# Patient Record
Sex: Female | Born: 1945 | Race: White | Hispanic: No | Smoking: Never smoker
Health system: Southern US, Community
[De-identification: ages and names within clinical notes are randomized; demographics above are authoritative.]

## PROBLEM LIST (undated history)

## (undated) DIAGNOSIS — M810 Age-related osteoporosis without current pathological fracture: Secondary | ICD-10-CM

## (undated) DIAGNOSIS — M1990 Unspecified osteoarthritis, unspecified site: Secondary | ICD-10-CM

## (undated) DIAGNOSIS — G479 Sleep disorder, unspecified: Secondary | ICD-10-CM

## (undated) DIAGNOSIS — R351 Nocturia: Secondary | ICD-10-CM

## (undated) DIAGNOSIS — R3915 Urgency of urination: Secondary | ICD-10-CM

## (undated) DIAGNOSIS — K219 Gastro-esophageal reflux disease without esophagitis: Secondary | ICD-10-CM

## (undated) DIAGNOSIS — Z8489 Family history of other specified conditions: Secondary | ICD-10-CM

## (undated) DIAGNOSIS — K5900 Constipation, unspecified: Secondary | ICD-10-CM

## (undated) HISTORY — PX: WISDOM TOOTH EXTRACTION: SHX21

---

## 2012-02-03 DIAGNOSIS — J301 Allergic rhinitis due to pollen: Secondary | ICD-10-CM | POA: Insufficient documentation

## 2014-03-30 DIAGNOSIS — M7581 Other shoulder lesions, right shoulder: Secondary | ICD-10-CM | POA: Insufficient documentation

## 2014-03-30 DIAGNOSIS — M5416 Radiculopathy, lumbar region: Secondary | ICD-10-CM | POA: Insufficient documentation

## 2014-03-30 DIAGNOSIS — M8700 Idiopathic aseptic necrosis of unspecified bone: Secondary | ICD-10-CM | POA: Insufficient documentation

## 2014-05-19 DIAGNOSIS — G8929 Other chronic pain: Secondary | ICD-10-CM | POA: Insufficient documentation

## 2014-09-21 NOTE — Progress Notes (Signed)
Please put orders in Epic surgery 10-13-14 pre op 10-05-13 Thanks

## 2014-09-22 ENCOUNTER — Other Ambulatory Visit (HOSPITAL_COMMUNITY): Payer: Self-pay | Admitting: Orthopaedic Surgery

## 2014-10-04 ENCOUNTER — Other Ambulatory Visit (HOSPITAL_COMMUNITY): Payer: Self-pay | Admitting: *Deleted

## 2014-10-04 NOTE — Patient Instructions (Addendum)
Karen BeltonDianne Hernandez  10/04/2014   Your procedure is scheduled on: 10/13/14   [facility] and follow signs to               Short Stay Center at 8:30 AM.   Call this number if you have problems the morning of surgery [phone]   Remember:  Do not eat food or drink liquids :After Midnight.     Take these medicines the morning of surgery with A SIP OF WATER: MAY TAKE HYDROCODONE IF NEEDED                               You may not have any metal on your body including hair pins and              piercings  Do not wear jewelry, make-up, lotions, powders or perfumes.             Do not wear nail polish.  Do not shave  48 hours prior to surgery.              Men may shave face and neck.   Do not bring valuables to the hospital. [place] IS NOT             RESPONSIBLE   FOR VALUABLES.  Contacts, dentures or bridgework may not be worn into surgery.  Leave suitcase in the car. After surgery it may be brought to your room.     Patients discharged the day of surgery will not be allowed to drive home.  Name and phone number of your driver:  Special Instructions: N/A              Please read over the following fact sheets you were given: _____________________________________________________________________                                                     [place] - PREPARING FOR SURGERY  Before surgery, you can play an important role.  Because skin is not sterile, your skin needs to be as free of germs as possible.  You can reduce the number of germs on your skin by washing with CHG (chlorahexidine gluconate) soap before surgery.  CHG is an antiseptic cleaner which kills germs and bonds with the skin to continue killing germs even after washing. Please DO NOT use if you have an allergy to CHG or antibacterial soaps.  If your skin becomes reddened/irritated stop using the CHG and inform your nurse when you arrive at Short Stay. Do not shave  (including legs and underarms) for at least 48 hours prior to the first CHG shower.  You may shave your face. Please follow these instructions carefully:   1.  Shower with CHG Soap the night before surgery and the  morning of Surgery.   2.  If you choose to wash your hair, wash your hair first as usual with your  normal  Shampoo.   3.  After you shampoo, rinse your hair and body thoroughly to remove the  shampoo.  4.  Use CHG as you would any other liquid soap.  You can apply chg directly  to the skin and wash . Gently wash with scrungie or clean wascloth    5.  Apply the CHG Soap to your body ONLY FROM THE NECK DOWN.   Do not use on open                           Wound or open sores. Avoid contact with eyes, ears mouth and genitals (private parts).                        Genitals (private parts) with your normal soap.              6.  Wash thoroughly, paying special attention to the area where your surgery  will be performed.   7.  Thoroughly rinse your body with warm water from the neck down.   8.  DO NOT shower/wash with your normal soap after using and rinsing off  the CHG Soap .                9.  Pat yourself dry with a clean towel.             10.  Wear clean pajamas.             11.  Place clean sheets on your bed the night of your first shower and do not  sleep with pets.  Day of Surgery : Do not apply any lotions/deodorants the morning of surgery.  Please wear clean clothes to the hospital/surgery center.  FAILURE TO FOLLOW THESE INSTRUCTIONS MAY RESULT IN THE CANCELLATION OF YOUR SURGERY    PATIENT SIGNATURE_________________________________  ______________________________________________________________________

## 2014-10-05 ENCOUNTER — Encounter (HOSPITAL_COMMUNITY): Payer: Self-pay

## 2014-10-05 ENCOUNTER — Encounter (HOSPITAL_COMMUNITY)
Admission: RE | Admit: 2014-10-05 | Discharge: 2014-10-05 | Disposition: A | Discharge status: Home / Self Care | Payer: Medicare HMO | Source: Ambulatory Visit | Attending: Orthopaedic Surgery | Admitting: Orthopaedic Surgery

## 2014-10-05 DIAGNOSIS — Z01812 Encounter for preprocedural laboratory examination: Secondary | ICD-10-CM | POA: Diagnosis present

## 2014-10-05 HISTORY — DX: Nocturia: R35.1

## 2014-10-05 HISTORY — DX: Constipation, unspecified: K59.00

## 2014-10-05 HISTORY — DX: Unspecified osteoarthritis, unspecified site: M19.90

## 2014-10-05 HISTORY — DX: Urgency of urination: R39.15

## 2014-10-05 HISTORY — DX: Sleep disorder, unspecified: G47.9

## 2014-10-05 HISTORY — DX: Family history of other specified conditions: Z84.89

## 2014-10-05 HISTORY — DX: Age-related osteoporosis without current pathological fracture: M81.0

## 2014-10-05 LAB — URINALYSIS, ROUTINE W REFLEX MICROSCOPIC
BILIRUBIN URINE: NEGATIVE
Glucose, UA: NEGATIVE mg/dL
Hgb urine dipstick: NEGATIVE
Ketones, ur: NEGATIVE mg/dL
Leukocytes, UA: NEGATIVE
NITRITE: NEGATIVE
PH: 7.5 (ref 5.0–8.0)
PROTEIN: NEGATIVE mg/dL
Specific Gravity, Urine: 1.01 (ref 1.005–1.030)
Urobilinogen, UA: 0.2 mg/dL (ref 0.0–1.0)

## 2014-10-05 LAB — BASIC METABOLIC PANEL
Anion gap: 7 (ref 5–15)
BUN: 22 mg/dL (ref 6–23)
CALCIUM: 9.2 mg/dL (ref 8.4–10.5)
CO2: 31 mmol/L (ref 19–32)
Chloride: 100 mEq/L (ref 96–112)
Creatinine, Ser: 0.64 mg/dL (ref 0.50–1.10)
GFR calc Af Amer: >90 mL/min (ref >90)
Glucose, Bld: 103 mg/dL — ABNORMAL HIGH (ref 70–99)
Potassium: 4 mmol/L (ref 3.5–5.1)
Sodium: 138 mmol/L (ref 135–145)

## 2014-10-05 LAB — CBC
HCT: 36.7 % (ref 36.0–46.0)
Hemoglobin: 11.6 g/dL — ABNORMAL LOW (ref 12.0–15.0)
MCH: 27.8 pg (ref 26.0–34.0)
MCHC: 31.6 g/dL (ref 30.0–36.0)
MCV: 88 fL (ref 78.0–100.0)
PLATELETS: 308 10*3/uL (ref 150–400)
RBC: 4.17 MIL/uL (ref 3.87–5.11)
RDW: 13 % (ref 11.5–15.5)
WBC: 6.5 10*3/uL (ref 4.0–10.5)

## 2014-10-05 LAB — SURGICAL PCR SCREEN
MRSA, PCR: NEGATIVE
STAPHYLOCOCCUS AUREUS: NEGATIVE

## 2014-10-05 LAB — PROTIME-INR
INR: 0.98 (ref 0.00–1.49)
PROTHROMBIN TIME: 13.1 s (ref 11.6–15.2)

## 2014-10-05 LAB — APTT: APTT: 27 s (ref 24–37)

## 2014-10-05 LAB — ABO/RH: ABO/RH(D): A POS

## 2014-10-13 ENCOUNTER — Encounter (HOSPITAL_COMMUNITY): Payer: Self-pay | Admitting: Certified Registered"

## 2014-10-13 ENCOUNTER — Inpatient Hospital Stay (HOSPITAL_COMMUNITY): Payer: Medicare HMO

## 2014-10-13 ENCOUNTER — Inpatient Hospital Stay (HOSPITAL_COMMUNITY): Payer: Medicare HMO | Admitting: Certified Registered"

## 2014-10-13 ENCOUNTER — Encounter (HOSPITAL_COMMUNITY)
Admission: RE | Disposition: A | Discharge status: Home / Self Care | Payer: Self-pay | Source: Ambulatory Visit | Attending: Orthopaedic Surgery

## 2014-10-13 ENCOUNTER — Inpatient Hospital Stay (HOSPITAL_COMMUNITY)
Admission: RE | Admit: 2014-10-13 | Discharge: 2014-10-15 | DRG: 470 | Disposition: A | Discharge status: Home / Self Care | Payer: Medicare HMO | Source: Ambulatory Visit | Attending: Orthopaedic Surgery | Admitting: Orthopaedic Surgery

## 2014-10-13 DIAGNOSIS — Z96641 Presence of right artificial hip joint: Secondary | ICD-10-CM

## 2014-10-13 DIAGNOSIS — D62 Acute posthemorrhagic anemia: Secondary | ICD-10-CM | POA: Diagnosis not present

## 2014-10-13 DIAGNOSIS — R351 Nocturia: Secondary | ICD-10-CM | POA: Diagnosis present

## 2014-10-13 DIAGNOSIS — M1611 Unilateral primary osteoarthritis, right hip: Principal | ICD-10-CM

## 2014-10-13 DIAGNOSIS — Z419 Encounter for procedure for purposes other than remedying health state, unspecified: Secondary | ICD-10-CM

## 2014-10-13 DIAGNOSIS — R3915 Urgency of urination: Secondary | ICD-10-CM | POA: Diagnosis present

## 2014-10-13 DIAGNOSIS — K5900 Constipation, unspecified: Secondary | ICD-10-CM | POA: Diagnosis present

## 2014-10-13 HISTORY — PX: TOTAL HIP ARTHROPLASTY: SHX124

## 2014-10-13 LAB — TYPE AND SCREEN
ABO/RH(D): A POS
ANTIBODY SCREEN: NEGATIVE

## 2014-10-13 SURGERY — ARTHROPLASTY, HIP, TOTAL, ANTERIOR APPROACH
Anesthesia: Spinal | Site: Hip | Laterality: Right

## 2014-10-13 MED ORDER — LACTATED RINGERS IV SOLN
INTRAVENOUS | Status: DC
Start: 2014-10-13 — End: 2014-10-13
  Administered 2014-10-13: 12:00:00 via INTRAVENOUS
  Administered 2014-10-13: 1000 mL via INTRAVENOUS

## 2014-10-13 MED ORDER — HYDROMORPHONE HCL 1 MG/ML IJ SOLN
1.0000 mg | INTRAMUSCULAR | Status: DC | PRN
  Administered 2014-10-13 – 2014-10-14 (×2): 1 mg via INTRAVENOUS
  Filled 2014-10-13 (×2): qty 1

## 2014-10-13 MED ORDER — PROPOFOL INFUSION 10 MG/ML OPTIME
INTRAVENOUS | Status: DC | PRN
  Administered 2014-10-13: 100 ug/kg/min via INTRAVENOUS

## 2014-10-13 MED ORDER — SODIUM CHLORIDE 0.9 % IR SOLN
Status: DC | PRN
  Administered 2014-10-13: 1000 mL

## 2014-10-13 MED ORDER — DIPHENHYDRAMINE HCL 12.5 MG/5ML PO ELIX: 12.5000 mg | ORAL_SOLUTION | ORAL | Status: DC | PRN

## 2014-10-13 MED ORDER — EPHEDRINE SULFATE 50 MG/ML IJ SOLN
INTRAMUSCULAR | Status: AC
  Filled 2014-10-13: qty 1

## 2014-10-13 MED ORDER — METOCLOPRAMIDE HCL 5 MG/ML IJ SOLN: 5.0000 mg | Freq: Three times a day (TID) | INTRAMUSCULAR | Status: DC | PRN

## 2014-10-13 MED ORDER — ACETAMINOPHEN 650 MG RE SUPP: 650.0000 mg | Freq: Four times a day (QID) | RECTAL | Status: DC | PRN

## 2014-10-13 MED ORDER — FENTANYL CITRATE 0.05 MG/ML IJ SOLN: 25.0000 ug | INTRAMUSCULAR | Status: DC | PRN

## 2014-10-13 MED ORDER — METHOCARBAMOL 500 MG PO TABS
500.0000 mg | ORAL_TABLET | Freq: Four times a day (QID) | ORAL | Status: DC | PRN
  Administered 2014-10-13 – 2014-10-14 (×2): 500 mg via ORAL
  Filled 2014-10-13 (×2): qty 1

## 2014-10-13 MED ORDER — BUPIVACAINE HCL (PF) 0.5 % IJ SOLN
INTRAMUSCULAR | Status: AC
  Filled 2014-10-13: qty 30

## 2014-10-13 MED ORDER — DOCUSATE SODIUM 100 MG PO CAPS
100.0000 mg | ORAL_CAPSULE | Freq: Two times a day (BID) | ORAL | Status: DC
  Administered 2014-10-13 – 2014-10-15 (×4): 100 mg via ORAL
  Filled 2014-10-13 (×3): qty 1

## 2014-10-13 MED ORDER — CEFAZOLIN SODIUM-DEXTROSE 2-3 GM-% IV SOLR
INTRAVENOUS | Status: AC
  Filled 2014-10-13: qty 50

## 2014-10-13 MED ORDER — 0.9 % SODIUM CHLORIDE (POUR BTL) OPTIME
TOPICAL | Status: DC | PRN
  Administered 2014-10-13: 1000 mL

## 2014-10-13 MED ORDER — ALUM & MAG HYDROXIDE-SIMETH 200-200-20 MG/5ML PO SUSP: 30.0000 mL | ORAL | Status: DC | PRN

## 2014-10-13 MED ORDER — SODIUM CHLORIDE 0.9 % IJ SOLN
INTRAMUSCULAR | Status: AC
  Filled 2014-10-13: qty 10

## 2014-10-13 MED ORDER — ACETAMINOPHEN 10 MG/ML IV SOLN
1000.0000 mg | Freq: Once | INTRAVENOUS | Status: AC
  Administered 2014-10-13: 1000 mg via INTRAVENOUS
  Filled 2014-10-13: qty 100

## 2014-10-13 MED ORDER — OXYCODONE HCL 5 MG PO TABS
5.0000 mg | ORAL_TABLET | ORAL | Status: DC | PRN
  Administered 2014-10-13: 10 mg via ORAL
  Administered 2014-10-13: 5 mg via ORAL
  Administered 2014-10-13 – 2014-10-14 (×2): 10 mg via ORAL
  Administered 2014-10-14: 5 mg via ORAL
  Administered 2014-10-14: 10 mg via ORAL
  Administered 2014-10-14: 5 mg via ORAL
  Administered 2014-10-14 – 2014-10-15 (×2): 10 mg via ORAL
  Administered 2014-10-15: 5 mg via ORAL
  Administered 2014-10-15 (×2): 10 mg via ORAL
  Filled 2014-10-13: qty 1
  Filled 2014-10-13 (×4): qty 2
  Filled 2014-10-13 (×2): qty 1
  Filled 2014-10-13 (×3): qty 2
  Filled 2014-10-13: qty 1
  Filled 2014-10-13: qty 2

## 2014-10-13 MED ORDER — PROMETHAZINE HCL 25 MG/ML IJ SOLN: 6.2500 mg | INTRAMUSCULAR | Status: DC | PRN

## 2014-10-13 MED ORDER — ASPIRIN EC 325 MG PO TBEC
325.0000 mg | DELAYED_RELEASE_TABLET | Freq: Two times a day (BID) | ORAL | Status: DC
  Administered 2014-10-13 – 2014-10-15 (×4): 325 mg via ORAL
  Filled 2014-10-13 (×7): qty 1

## 2014-10-13 MED ORDER — ACETAMINOPHEN 325 MG PO TABS
650.0000 mg | ORAL_TABLET | Freq: Four times a day (QID) | ORAL | Status: DC | PRN
  Administered 2014-10-14: 650 mg via ORAL
  Filled 2014-10-13: qty 2

## 2014-10-13 MED ORDER — MENTHOL 3 MG MT LOZG
1.0000 | LOZENGE | OROMUCOSAL | Status: DC | PRN
  Filled 2014-10-13: qty 9

## 2014-10-13 MED ORDER — ZOLPIDEM TARTRATE 5 MG PO TABS: 5.0000 mg | ORAL_TABLET | Freq: Every evening | ORAL | Status: DC | PRN

## 2014-10-13 MED ORDER — PHENOL 1.4 % MT LIQD
1.0000 | OROMUCOSAL | Status: DC | PRN
  Filled 2014-10-13: qty 177

## 2014-10-13 MED ORDER — MIDAZOLAM HCL 2 MG/2ML IJ SOLN
INTRAMUSCULAR | Status: AC
  Filled 2014-10-13: qty 2

## 2014-10-13 MED ORDER — METHOCARBAMOL 1000 MG/10ML IJ SOLN
500.0000 mg | Freq: Four times a day (QID) | INTRAMUSCULAR | Status: DC | PRN
  Administered 2014-10-13: 500 mg via INTRAVENOUS
  Filled 2014-10-13 (×2): qty 5

## 2014-10-13 MED ORDER — DEXAMETHASONE SODIUM PHOSPHATE 10 MG/ML IJ SOLN
INTRAMUSCULAR | Status: DC | PRN
  Administered 2014-10-13: 10 mg via INTRAVENOUS

## 2014-10-13 MED ORDER — ONDANSETRON HCL 4 MG/2ML IJ SOLN
INTRAMUSCULAR | Status: DC | PRN
  Administered 2014-10-13: 4 mg via INTRAVENOUS

## 2014-10-13 MED ORDER — ONDANSETRON HCL 4 MG/2ML IJ SOLN: 4.0000 mg | Freq: Four times a day (QID) | INTRAMUSCULAR | Status: DC | PRN

## 2014-10-13 MED ORDER — MIDAZOLAM HCL 5 MG/5ML IJ SOLN
INTRAMUSCULAR | Status: DC | PRN
  Administered 2014-10-13: 2 mg via INTRAVENOUS

## 2014-10-13 MED ORDER — PROPOFOL 10 MG/ML IV BOLUS
INTRAVENOUS | Status: AC
  Filled 2014-10-13: qty 20

## 2014-10-13 MED ORDER — ONDANSETRON HCL 4 MG/2ML IJ SOLN
INTRAMUSCULAR | Status: AC
  Filled 2014-10-13: qty 2

## 2014-10-13 MED ORDER — LIDOCAINE HCL (CARDIAC) 20 MG/ML IV SOLN
INTRAVENOUS | Status: AC
  Filled 2014-10-13: qty 5

## 2014-10-13 MED ORDER — METOCLOPRAMIDE HCL 10 MG PO TABS: 5.0000 mg | ORAL_TABLET | Freq: Three times a day (TID) | ORAL | Status: DC | PRN

## 2014-10-13 MED ORDER — TRANEXAMIC ACID 100 MG/ML IV SOLN
1000.0000 mg | INTRAVENOUS | Status: AC
  Administered 2014-10-13: 1000 mg via INTRAVENOUS
  Filled 2014-10-13: qty 10

## 2014-10-13 MED ORDER — BUPIVACAINE HCL (PF) 0.5 % IJ SOLN
INTRAMUSCULAR | Status: DC | PRN
  Administered 2014-10-13: 3 mL

## 2014-10-13 MED ORDER — CEFAZOLIN SODIUM-DEXTROSE 2-3 GM-% IV SOLR
2.0000 g | INTRAVENOUS | Status: AC
  Administered 2014-10-13: 2 g via INTRAVENOUS

## 2014-10-13 MED ORDER — ONDANSETRON HCL 4 MG PO TABS: 4.0000 mg | ORAL_TABLET | Freq: Four times a day (QID) | ORAL | Status: DC | PRN

## 2014-10-13 MED ORDER — SODIUM CHLORIDE 0.9 % IV SOLN
INTRAVENOUS | Status: DC
  Administered 2014-10-13 – 2014-10-14 (×2): via INTRAVENOUS

## 2014-10-13 MED ORDER — POLYETHYLENE GLYCOL 3350 17 G PO PACK
17.0000 g | PACK | Freq: Every day | ORAL | Status: DC | PRN
  Administered 2014-10-14: 17 g via ORAL
  Filled 2014-10-13 (×2): qty 1

## 2014-10-13 MED ORDER — EPHEDRINE SULFATE 50 MG/ML IJ SOLN
INTRAMUSCULAR | Status: DC | PRN
  Administered 2014-10-13 (×3): 10 mg via INTRAVENOUS

## 2014-10-13 MED ORDER — CEFAZOLIN SODIUM 1-5 GM-% IV SOLN
1.0000 g | Freq: Four times a day (QID) | INTRAVENOUS | Status: AC
  Administered 2014-10-13 (×2): 1 g via INTRAVENOUS
  Filled 2014-10-13 (×2): qty 50

## 2014-10-13 MED ORDER — FENTANYL CITRATE 0.05 MG/ML IJ SOLN
INTRAMUSCULAR | Status: AC
  Filled 2014-10-13: qty 2

## 2014-10-13 MED ORDER — FENTANYL CITRATE 0.05 MG/ML IJ SOLN
INTRAMUSCULAR | Status: DC | PRN
  Administered 2014-10-13 (×2): 50 ug via INTRAVENOUS

## 2014-10-13 MED ORDER — LIDOCAINE HCL (CARDIAC) 20 MG/ML IV SOLN
INTRAVENOUS | Status: DC | PRN
  Administered 2014-10-13: 20 mg via INTRAVENOUS

## 2014-10-13 MED ORDER — DEXAMETHASONE SODIUM PHOSPHATE 10 MG/ML IJ SOLN
INTRAMUSCULAR | Status: AC
  Filled 2014-10-13: qty 1

## 2014-10-13 MED ORDER — MEPERIDINE HCL 50 MG/ML IJ SOLN: 6.2500 mg | INTRAMUSCULAR | Status: DC | PRN

## 2014-10-13 SURGICAL SUPPLY — 45 items
BAG ZIPLOCK 12X15 (MISCELLANEOUS) IMPLANT
BENZOIN TINCTURE PRP APPL 2/3 (GAUZE/BANDAGES/DRESSINGS) ×2 IMPLANT
BLADE SAW SGTL 18X1.27X75 (BLADE) ×2 IMPLANT
CAPT HIP TOTAL 2 ×2 IMPLANT
CELLS DAT CNTRL 66122 CELL SVR (MISCELLANEOUS) ×1 IMPLANT
COVER PERINEAL POST (MISCELLANEOUS) ×2 IMPLANT
DRAPE C-ARM 42X120 X-RAY (DRAPES) ×2 IMPLANT
DRAPE STERI IOBAN 125X83 (DRAPES) ×2 IMPLANT
DRAPE U-SHAPE 47X51 STRL (DRAPES) ×6 IMPLANT
DRSG AQUACEL AG ADV 3.5X10 (GAUZE/BANDAGES/DRESSINGS) ×2 IMPLANT
DURAPREP 26ML APPLICATOR (WOUND CARE) ×2 IMPLANT
ELECT BLADE TIP CTD 4 INCH (ELECTRODE) ×2 IMPLANT
ELECT REM PT RETURN 9FT ADLT (ELECTROSURGICAL) ×2
ELECTRODE REM PT RTRN 9FT ADLT (ELECTROSURGICAL) ×1 IMPLANT
FACESHIELD WRAPAROUND (MASK) ×8 IMPLANT
GAUZE XEROFORM 1X8 LF (GAUZE/BANDAGES/DRESSINGS) IMPLANT
GLOVE BIO SURGEON STRL SZ7 (GLOVE) ×2 IMPLANT
GLOVE BIO SURGEON STRL SZ7.5 (GLOVE) ×4 IMPLANT
GLOVE BIOGEL PI IND STRL 6.5 (GLOVE) ×1 IMPLANT
GLOVE BIOGEL PI IND STRL 7.0 (GLOVE) ×1 IMPLANT
GLOVE BIOGEL PI IND STRL 7.5 (GLOVE) ×1 IMPLANT
GLOVE BIOGEL PI IND STRL 8 (GLOVE) ×2 IMPLANT
GLOVE BIOGEL PI INDICATOR 6.5 (GLOVE) ×1
GLOVE BIOGEL PI INDICATOR 7.0 (GLOVE) ×1
GLOVE BIOGEL PI INDICATOR 7.5 (GLOVE) ×1
GLOVE BIOGEL PI INDICATOR 8 (GLOVE) ×2
GLOVE ECLIPSE 8.0 STRL XLNG CF (GLOVE) ×2 IMPLANT
GOWN STRL REUS W/TWL XL LVL3 (GOWN DISPOSABLE) ×8 IMPLANT
HANDPIECE INTERPULSE COAX TIP (DISPOSABLE) ×1
KIT BASIN OR (CUSTOM PROCEDURE TRAY) ×2 IMPLANT
PACK TOTAL JOINT (CUSTOM PROCEDURE TRAY) ×2 IMPLANT
RTRCTR WOUND ALEXIS 18CM MED (MISCELLANEOUS) ×2
SET HNDPC FAN SPRY TIP SCT (DISPOSABLE) ×1 IMPLANT
STAPLER VISISTAT 35W (STAPLE) IMPLANT
STRIP CLOSURE SKIN 1/2X4 (GAUZE/BANDAGES/DRESSINGS) ×2 IMPLANT
SUT ETHIBOND NAB CT1 #1 30IN (SUTURE) ×2 IMPLANT
SUT ETHILON 3 0 PS 1 (SUTURE) ×2 IMPLANT
SUT MNCRL AB 4-0 PS2 18 (SUTURE) IMPLANT
SUT VIC AB 0 CT1 36 (SUTURE) ×2 IMPLANT
SUT VIC AB 1 CT1 36 (SUTURE) ×2 IMPLANT
SUT VIC AB 2-0 CT1 27 (SUTURE) ×2
SUT VIC AB 2-0 CT1 TAPERPNT 27 (SUTURE) ×2 IMPLANT
TOWEL OR 17X26 10 PK STRL BLUE (TOWEL DISPOSABLE) ×2 IMPLANT
TOWEL OR NON WOVEN STRL DISP B (DISPOSABLE) ×2 IMPLANT
TRAY FOLEY CATH 14FRSI W/METER (CATHETERS) ×2 IMPLANT

## 2014-10-13 NOTE — Brief Op Note (Signed)
10/13/2014  11:45 AM  PATIENT:  Cecilio Asperianne Mizuno  68 y.o. female  PRE-OPERATIVE DIAGNOSIS:  Osteoarthritis right hip  POST-OPERATIVE DIAGNOSIS:  Osteoarthritis right hip  PROCEDURE:  Procedure(s): RIGHT TOTAL HIP ARTHROPLASTY ANTERIOR APPROACH (Right)  SURGEON:  Surgeon(s) and Role:    * Kathryne Hitchhristopher Y Farha Dano, MD - Primary  PHYSICIAN ASSISTANT: Rexene EdisonGil Clark, PA-C  ANESTHESIA:   spinal  EBL:  Total I/O In: 1000 [I.V.:1000] Out: -   BLOOD ADMINISTERED:none  DRAINS: none   LOCAL MEDICATIONS USED:  NONE  SPECIMEN:  No Specimen  DISPOSITION OF SPECIMEN:  N/A  COUNTS:  YES  TOURNIQUET:  * No tourniquets in log *  DICTATION: .Other Dictation: Dictation Number [id number]  PLAN OF CARE: Admit to inpatient   PATIENT DISPOSITION:  PACU - hemodynamically stable.   Delay start of Pharmacological VTE agent (>24hrs) due to surgical blood loss or risk of bleeding: no

## 2014-10-13 NOTE — Transfer of Care (Signed)
Immediate Anesthesia Transfer of Care Note  Patient: Karen Hernandez  Procedure(s) Performed: Procedure(s) (LRB): RIGHT TOTAL HIP ARTHROPLASTY ANTERIOR APPROACH (Right)  Patient Location: PACU  Anesthesia Type: Spinal  Level of Consciousness: sedated, patient cooperative and responds to stimulation  Airway & Oxygen Therapy: Patient Spontanous Breathing and Patient connected to face mask oxgen  Post-op Assessment: Report given to PACU RN and Post -op Vital signs reviewed and stable  Post vital signs: Reviewed and stable  Complications: No apparent anesthesia complications. L1 level on exam denied pain on assessment.

## 2014-10-13 NOTE — Progress Notes (Signed)
Dr. Berneice HeinrichManny in to see patient- made aware of spinal level - L3- O.K. To go to the floor.

## 2014-10-13 NOTE — Anesthesia Preprocedure Evaluation (Addendum)
Anesthesia Evaluation  Patient identified by MRN, date of birth, ID band Patient awake    Reviewed: Allergy & Precautions, H&P , NPO status , Patient's Chart, lab work & pertinent test results, reviewed documented beta blocker date and time   Airway Mallampati: II   Neck ROM: Full    Dental  (+) Teeth Intact   Pulmonary  breath sounds clear to auscultation        Cardiovascular negative cardio ROS  Rhythm:Regular     Neuro/Psych    GI/Hepatic negative GI ROS, Neg liver ROS,   Endo/Other  negative endocrine ROS  Renal/GU negative Renal ROS     Musculoskeletal   Abdominal (+)  Abdomen: soft.    Peds  Hematology negative hematology ROS (+)   Anesthesia Other Findings   Reproductive/Obstetrics                            Anesthesia Physical Anesthesia Plan  ASA: III  Anesthesia Plan: Spinal   Post-op Pain Management:    Induction: Intravenous  Airway Management Planned:   Additional Equipment:   Intra-op Plan:   Post-operative Plan:   Informed Consent: I have reviewed the patients History and Physical, chart, labs and discussed the procedure including the risks, benefits and alternatives for the proposed anesthesia with the patient or authorized representative who has indicated his/her understanding and acceptance.     Plan Discussed with:   Anesthesia Plan Comments:         Anesthesia Quick Evaluation

## 2014-10-13 NOTE — Anesthesia Postprocedure Evaluation (Signed)
  Anesthesia Post-op Note  Patient: Doctor, general practiceDianne Hernandez  Procedure(s) Performed: Procedure(s): RIGHT TOTAL HIP ARTHROPLASTY ANTERIOR APPROACH (Right)  Patient Location: PACU  Anesthesia Type:Spinal  Level of Consciousness: awake and alert   Airway and Oxygen Therapy: Patient Spontanous Breathing and Patient connected to nasal cannula oxygen  Post-op Pain: mild  Post-op Assessment: Post-op Vital signs reviewed, Patient's Cardiovascular Status Stable, Respiratory Function Stable and Patent Airway  Post-op Vital Signs: Reviewed and stable  Last Vitals:  Filed Vitals:   10/13/14 1245  BP: 112/46  Pulse: 71  Temp:   Resp: 15    Complications: No apparent anesthesia complications

## 2014-10-13 NOTE — Progress Notes (Signed)
X-ray results noted 

## 2014-10-13 NOTE — Anesthesia Procedure Notes (Signed)
Spinal Patient location during procedure: OR Start time: 10/13/2014 10:32 AM End time: 10/13/2014 10:37 AM Staffing Anesthesiologist: Alexis Frock Resident/CRNA: Lajuana Carry E Performed by: resident/CRNA  Preanesthetic Checklist Completed: patient identified, site marked, surgical consent, pre-op evaluation, timeout performed, IV checked, risks and benefits discussed and monitors and equipment checked Spinal Block Patient position: sitting Prep: Betadine Patient monitoring: heart rate, continuous pulse ox and blood pressure Approach: midline Location: L3-4 Injection technique: single-shot Needle Needle type: Sprotte  Needle gauge: 24 G Needle length: 10 cm Assessment Sensory level: T6 Additional Notes Expiration date of kit checked and confirmed. Clear CSF pre/post injection, neg heme. Patient tolerated procedure well, without complications.

## 2014-10-13 NOTE — Progress Notes (Signed)
Portable AP Pelvis and Lateral Right Hip X-RAYS done 

## 2014-10-13 NOTE — Progress Notes (Signed)
Utilization review completed.  

## 2014-10-13 NOTE — H&P (Signed)
TOTAL HIP ADMISSION H&P  Patient is admitted for right total hip arthroplasty.  Subjective:  Chief Complaint: right hip pain  HPI: Karen AsperDianne Hernandez, 68 y.o. female, has a history of pain and functional disability in the right hip(s) due to arthritis and patient has failed non-surgical conservative treatments for greater than 12 weeks to include NSAID's and/or analgesics, corticosteriod injections, flexibility and strengthening excercises, use of assistive devices and activity modification.  Onset of symptoms was abrupt starting 1 years ago with gradually worsening course since that time.The patient noted no past surgery on the right hip(s).  Patient currently rates pain in the right hip at 9 out of 10 with activity. Patient has night pain, worsening of pain with activity and weight bearing, pain that interfers with activities of daily living and pain with passive range of motion. Patient has evidence of subchondral cysts, subchondral sclerosis, periarticular osteophytes and joint space narrowing by imaging studies. This condition presents safety issues increasing the risk of falls.  There is no current active infection.  Patient Active Problem List   Diagnosis Date Noted  . Arthritis of right hip 10/13/2014   Past Medical History  Diagnosis Date  . Family history of adverse reaction to anesthesia     "MOTHER HAD MEMORY PROBLEMS AFTER SURG"  . Arthritis   . Osteoporosis   . Constipation   . Urgency of urination   . Nocturia   . Difficulty sleeping     DUE TO PAIN    History reviewed. No pertinent past surgical history.  Prescriptions prior to admission  Medication Sig Dispense Refill Last Dose  . Ascorbic Acid (VITAMIN C PO) Take 1 tablet by mouth daily as needed (to fight off cold//sinuses).   09/29/2014  . Cholecalciferol (VITAMIN D) 2000 UNITS CAPS Take 1 capsule by mouth every morning.   09/29/2014  . FIBER PO Take 3-4 each by mouth at bedtime. Chewable.   09/29/2014  .  HYDROcodone-acetaminophen (NORCO) 7.5-325 MG per tablet Take 1-2 tablets by mouth 3 (three) times daily as needed for moderate pain.   10/13/2014 at 0630  . Liniments (DEEP BLUE RELIEF EX) Apply 1 application topically at bedtime as needed (pain).   09/29/2014  . Liniments (SALONPAS EX) Apply 1 each topically daily as needed (hip/knee pain.).   09/29/2014  . Menthol, Topical Analgesic, (ICY HOT EX) Apply 1 application topically at bedtime as needed (pain).   09/29/2014  . methylPREDNISolone (MEDROL DOSEPAK) 4 MG tablet Take by mouth. follow package directions     . Multiple Vitamin (MULTIVITAMIN WITH MINERALS) TABS tablet Take 1 tablet by mouth every morning.   09/29/2014  . Naproxen Sodium (ALEVE) 220 MG CAPS Take 440 mg by mouth 3 (three) times daily as needed (pain).   Past Week at Unknown time  . OVER THE COUNTER MEDICATION 2-3 tablets at bedtime. Fiber force.   10/11/2014 at Unknown time  . trolamine salicylate (ASPERCREME) 10 % cream Apply 1 application topically at bedtime as needed for muscle pain.   10/11/2014  . oxymetazoline (AFRIN) 0.05 % nasal spray Place 1 spray into both nostrils at bedtime as needed for congestion.   Unknown at Unknown time   Allergies  Allergen Reactions  . Other     Dye in the drop to dilated eyes---"Made dizzy for weeks"  . Peanuts [Peanut Oil] Swelling    History  Substance Use Topics  . Smoking status: Never Smoker   . Smokeless tobacco: Not on file  . Alcohol Use: No  History reviewed. No pertinent family history.   Review of Systems  Musculoskeletal: Positive for joint pain.  All other systems reviewed and are negative.   Objective:  Physical Exam  Constitutional: She is oriented to person, place, and time. She appears well-developed and well-nourished.  HENT:  Head: Normocephalic and atraumatic.  Eyes: EOM are normal. Pupils are equal, round, and reactive to light.  Neck: Normal range of motion. Neck supple.  Cardiovascular: Normal rate  and regular rhythm.   Respiratory: Effort normal and breath sounds normal.  GI: Soft. Bowel sounds are normal.  Musculoskeletal:       Right hip: She exhibits decreased range of motion, decreased strength and bony tenderness.  Neurological: She is alert and oriented to person, place, and time.  Skin: Skin is warm and dry.  Psychiatric: She has a normal mood and affect.    Vital signs in last 24 hours: Temp:  [98.1 F (36.7 C)] 98.1 F (36.7 C) (12/31 0831) Pulse Rate:  [80] 80 (12/31 0831) Resp:  [18] 18 (12/31 0831) BP: (139)/(59) 139/59 mmHg (12/31 0831) SpO2:  [98 %] 98 % (12/31 0831) Weight:  [62.199 kg (137 lb 2 oz)] 62.199 kg (137 lb 2 oz) (12/31 0903)  Labs:   Estimated body mass index is 23.53 kg/(m^2) as calculated from the following:   Height as of this encounter: 5\' 4"  (1.626 m).   Weight as of this encounter: 62.199 kg (137 lb 2 oz).   Imaging Review Plain radiographs demonstrate severe degenerative joint disease of the right hip(s). The bone quality appears to be good for age and reported activity level.  Assessment/Plan:  End stage arthritis, right hip(s)  The patient history, physical examination, clinical judgement of the provider and imaging studies are consistent with end stage degenerative joint disease of the right hip(s) and total hip arthroplasty is deemed medically necessary. The treatment options including medical management, injection therapy, arthroscopy and arthroplasty were discussed at length. The risks and benefits of total hip arthroplasty were presented and reviewed. The risks due to aseptic loosening, infection, stiffness, dislocation/subluxation,  thromboembolic complications and other imponderables were discussed.  The patient acknowledged the explanation, agreed to proceed with the plan and consent was signed. Patient is being admitted for inpatient treatment for surgery, pain control, PT, OT, prophylactic antibiotics, VTE prophylaxis,  progressive ambulation and ADL's and discharge planning.The patient is planning to be discharged home with home health services

## 2014-10-14 LAB — CBC
HCT: 30.3 % — ABNORMAL LOW (ref 36.0–46.0)
Hemoglobin: 9.7 g/dL — ABNORMAL LOW (ref 12.0–15.0)
MCH: 28 pg (ref 26.0–34.0)
MCHC: 32 g/dL (ref 30.0–36.0)
MCV: 87.3 fL (ref 78.0–100.0)
PLATELETS: 271 10*3/uL (ref 150–400)
RBC: 3.47 MIL/uL — ABNORMAL LOW (ref 3.87–5.11)
RDW: 12.8 % (ref 11.5–15.5)
WBC: 11.5 10*3/uL — ABNORMAL HIGH (ref 4.0–10.5)

## 2014-10-14 LAB — BASIC METABOLIC PANEL
Anion gap: 6 (ref 5–15)
BUN: 15 mg/dL (ref 6–23)
CALCIUM: 8.8 mg/dL (ref 8.4–10.5)
CO2: 29 mmol/L (ref 19–32)
Chloride: 101 mEq/L (ref 96–112)
Creatinine, Ser: 0.57 mg/dL (ref 0.50–1.10)
GFR calc Af Amer: >90 mL/min (ref >90)
GFR calc non Af Amer: >90 mL/min (ref >90)
Glucose, Bld: 123 mg/dL — ABNORMAL HIGH (ref 70–99)
Potassium: 4.6 mmol/L (ref 3.5–5.1)
Sodium: 136 mmol/L (ref 135–145)

## 2014-10-14 MED ORDER — ASPIRIN 325 MG PO TBEC: 325.0000 mg | DELAYED_RELEASE_TABLET | Freq: Two times a day (BID) | ORAL | Status: DC

## 2014-10-14 MED ORDER — METHOCARBAMOL 500 MG PO TABS: 500.0000 mg | ORAL_TABLET | Freq: Four times a day (QID) | ORAL | Status: DC | PRN

## 2014-10-14 MED ORDER — KETOROLAC TROMETHAMINE 30 MG/ML IJ SOLN
30.0000 mg | Freq: Four times a day (QID) | INTRAMUSCULAR | Status: DC
Start: 2014-10-14 — End: 2014-10-15
  Administered 2014-10-14 (×2): 30 mg via INTRAVENOUS
  Administered 2014-10-14 (×2): 15 mg via INTRAVENOUS
  Administered 2014-10-15: 30 mg via INTRAVENOUS
  Filled 2014-10-14 (×8): qty 1

## 2014-10-14 MED ORDER — OXYCODONE-ACETAMINOPHEN 5-325 MG PO TABS
1.0000 | ORAL_TABLET | ORAL | Status: DC | PRN
Start: 2014-10-14 — End: 2020-03-22

## 2014-10-14 NOTE — Evaluation (Signed)
Occupational Therapy Evaluation Patient Details Name: Karen Hernandez MRN: [id number] DOB: Jan 16, 1946 Today's Date: 10/14/2014    History of Present Illness R THR - ant dir   Clinical Impression   Pt doing very well and is motivated. She can already reach down and don/doff R sock seated independently with some effort. Will follow on acute to progress safety and independence with self care tasks for return home with friends assisting.    Follow Up Recommendations  No OT follow up;Supervision/Assistance - 24 hour    Equipment Recommendations  None recommended by OT    Recommendations for Other Services       Precautions / Restrictions Precautions Precautions: Fall Restrictions Weight Bearing Restrictions: No Other Position/Activity Restrictions: WBAT      Mobility Bed Mobility             Transfers Overall transfer level: Needs assistance Equipment used: Rolling walker (2 wheeled) Transfers: Sit to/from Stand Sit to Stand: Min guard         General transfer comment: verbal cues for hand placement and Le management. She needed min assist to safely descend to toilet.    Balance                                            ADL Overall ADL's : Needs assistance/impaired Eating/Feeding: Independent;Sitting   Grooming: Wash/dry hands;Set up;Sitting   Upper Body Bathing: Set up;Sitting   Lower Body Bathing: Minimal assistance;Sit to/from stand   Upper Body Dressing : Set up;Sitting   Lower Body Dressing: Minimal assistance;Sit to/from stand   Toilet Transfer: Minimal assistance;Ambulation;Comfort height toilet;Grab bars   Toileting- Clothing Manipulation and Hygiene: Minimal assistance;Sit to/from stand         General ADL Comments: Pt has friends that will be assisting at d/c. Pt able to reach down and don/dioff R sock already. Discussed sequence for LB dressing and safety with sitting to don clothing then stand to pull up. She has a  toilet that she thinks is about 17 inches and has 2 rails installed into commode and would like to use this commode. She has another bathroom with a toilet riser but no handles, grab bar or vanity. Discussed the toilet with rails would likely be safter option. She thinks she shower chair she can use rather than her small built in seat.      Vision                     Perception     Praxis      Pertinent Vitals/Pain Pain Assessment: 0-10 Pain Score: 2  Pain Location: R hip Pain Descriptors / Indicators: Sore Pain Intervention(s): Limited activity within patient's tolerance;Monitored during session;Premedicated before session;Ice applied     Hand Dominance Right   Extremity/Trunk Assessment Upper Extremity Assessment Upper Extremity Assessment: Overall WFL for tasks assessed      Cervical / Trunk Assessment Cervical / Trunk Assessment: Normal   Communication Communication Communication: No difficulties   Cognition Arousal/Alertness: Awake/alert Behavior During Therapy: WFL for tasks assessed/performed Overall Cognitive Status: Within Functional Limits for tasks assessed                     General Comments       Exercises      Shoulder Instructions      Home Living Family/patient expects to be discharged  to:: Private residence Living Arrangements: Alone Available Help at Discharge: Friend(s) Type of Home: House Home Access: Stairs to enter [employer] of Steps: 4 Entrance Stairs-Rails: Right Home Layout: One level     Bathroom Shower/Tub: Producer, television/film/video: Standard     Home Equipment: Environmental consultant - 2 wheels;Cane - single point;Grab bars - toilet;Shower seat;Shower seat - built in;Toilet riser   Additional Comments: pt states she has a Sports administrator and thinks she has a shower chair or can get one.      Prior Functioning/Environment Level of Independence: Independent with assistive device(s);Independent              OT Diagnosis: Generalized weakness   OT Problem List: Decreased strength;Decreased knowledge of use of DME or AE   OT Treatment/Interventions: Self-care/ADL training;Patient/family education;Therapeutic activities;DME and/or AE instruction    OT Goals(Current goals can be found in the care plan section) Acute Rehab OT Goals Patient Stated Goal: return to independence OT Goal Formulation: With patient Time For Goal Achievement: 10/21/14 Potential to Achieve Goals: Good  OT Frequency: Min 2X/week   Barriers to D/C:            Co-evaluation              End of Session Equipment Utilized During Treatment: Rolling walker  Activity Tolerance: Patient tolerated treatment well Patient left: in chair;with call bell/phone within reach   Time: [id number] OT Time Calculation (min): 26 min Charges:  OT General Charges $OT Visit: 1 Procedure OT Evaluation $Initial OT Evaluation Tier I: 1 Procedure OT Treatments $Therapeutic Activity: 8-22 mins G-Codes:    Lennox Laity  [phone] 10/14/2014, 2:32 PM

## 2014-10-14 NOTE — Plan of Care (Signed)
Problem: Consults Goal: Diagnosis- Total Joint Replacement Outcome: Completed/Met Date Met:  10/14/14 Primary Total Hip RIGHT, Anterior

## 2014-10-14 NOTE — Op Note (Signed)
NAMEDAVINITY, FANARA NO.:  [id number]  MEDICAL RECORD NO.:  [id number]  LOCATION:  1617                         FACILITY:  [facility]  PHYSICIAN:  Vanita Panda. Magnus Ivan, M.D.DATE OF BIRTH:  12-11-1945  DATE OF PROCEDURE:  10/13/2014 DATE OF DISCHARGE:                              OPERATIVE REPORT   PREOPERATIVE DIAGNOSES:  Primary osteoarthritis and degenerative joint disease, right hip.  POSTOPERATIVE DIAGNOSES:  Primary osteoarthritis and degenerative joint disease, right hip.  PROCEDURE:  Right total hip arthroplasty direct anterior approach.  IMPLANTS:  DePuy Sector Gription acetabular component size 48, size 32+ 4 neutral polyethylene liner, size 11 Corail femoral component with standard offset, size 32+ 1 ceramic hip ball.  SURGEON:  Vanita Panda. Magnus Ivan, M.D.  ASSISTANT:  Richardean Canal, PA-C.  ANESTHESIA:  Spinal.  ANTIBIOTICS:  2 g IV Ancef.  BLOOD LOSS:  150 mL.  COMPLICATIONS:  None.  INDICATIONS:  Karen Hernandez is a very pleasant, 69 year old female, who did not report significant hip pain until severe fall in May 2015.  She then had the difficulty with ambulating with time over this year.  She has developed debilitating right hip pain and she has tried steroid injections in her hip and that has not helped.  She ambulates with a cane and her mobility has been decreased.  Her activities of daily living has been decreased.  Her quality of life has been severely affected.  X-rays show significant loss of her joint space.  There is periarticular osteophytes and sclerotic changes and I almost wonder if she has had some damage to her hip at the time of her fall.  Given the radiographic findings as well as her clinical exam findings, we have recommended a total hip arthroplasty with direct anterior approach.  We have explained the risk of acute blood loss anemia, nerve and vessel injury, fracture, infection, dislocation and DVT.  She understands  the goals of decreased pain, improved mobility, and overall improved quality of life.  PROCEDURE DESCRIPTION:  After informed consent was obtained, appropriate right hip was marked.  She was brought to the operating room and spinal anesthesia was obtained while she was on her stretcher.  She was then laid in the supine position on the stretcher.  A Foley catheter was placed and both feet had traction boots applied to them.  Next, she was placed supine on the Hana fracture table with the perineal post in place and both legs in-inline skeletal traction devices, but no traction applied.  Her right operative hip was then prepped and draped with DuraPrep and sterile drapes.  A time-out was called to identify correct patient, correct right hip.  I then made an incision inferior and posterior to the anterior superior iliac spine and carried this obliquely down the leg.  We dissected down the tensor fascia lata muscle and the tensor fascia was then divided, so we could proceed with a direct anterior approach to the hip.  We cauterized the lateral femoral circumflex vessels and then opened up the hip capsule in a L-type format finding a large joint effusion.  We then placed the Cobra retractors within the hip capsule and made our femoral neck cut with  an oscillating saw proximal to the lesser trochanter and completed this with an osteotome.  We placed a corkscrew guide in the femoral head and removed the femoral head in its entirety and found it to be significantly devoid of cartilage and flat.  We then cleaned the acetabulum debris and placed the bent Hohmann around the medial acetabular rim and a Cobra retractor laterally and I removed the acetabular labrum and debris within the acetabulum itself.  We then began reaming under direct visualization from a size 42 in 2 mm increments up to a size 48 with the last reamer also under direct fluoroscopy, so I could obtain our depth of reaming under  inclination and anteversion.  Once we were done with this, we placed the real DePuy sector Gription acetabular component size 48 and apex hole eliminator.  We then placed the real 32+ 4 neutral polyethylene liner for this acetabular component.  Attention was then turned to the femur.  With the leg externally rotated to 100 degrees extended and adducted, we were able to place a Mueller retractor medially and a Hohmann retractor behind the greater trochanter.  I released the joint capsule and then used a box cutting osteotome in the femoral canal and a rongeur to lateralize and then began broaching from a size 8 broach all the way up to a size 11.  With a size 11 in place, we used a calcar planer and then standard neck and a 32+ 1 hip ball.  We brought the leg back up and over and with traction and internal rotation, reduced it into the pelvis and it was stable.  Her offsets were measured equal and leg lengths were near equal as well.  We then dislocated the hip and removed the trial components and I placed the real Corail femoral component size 11, the real 32+ 1 ceramic hip ball, reduced this back in the acetabulum and again it was stable.  We copiously irrigated the soft tissues with normal saline solution using pulsatile lavage.  We closed the joint capsule with interrupted #1 Ethibond suture followed by running #1 Vicryl in the tensor fascia, 0- Vicryl in the deep tissue, 2-0 Vicryl in the subcutaneous tissue, 4-0 Monocryl subcuticular stitch, and Steri-Strips on the skin.  An Aquacel dressing was applied.  She was taken off the Hana table and taken to the recovery room in stable condition.  All final counts were correct and there were no complications noted.  Of note, Richardean Canal, PA-C assisted during the entire case and his assistance was crucial without every facet of this case to help facilitate.     Vanita Panda. Magnus Ivan, M.D.     CYB/MEDQ  D:  10/13/2014  T:   10/14/2014  Job:  [id number]

## 2014-10-14 NOTE — Progress Notes (Signed)
Subjective: 1 Day Post-Op Procedure(s) (LRB): RIGHT TOTAL HIP ARTHROPLASTY ANTERIOR APPROACH (Right) Patient reports pain as moderate.  Tough evening last night getting pain under control, but better this am.  Acute blood loss anemia from surgery.  Will continue to monitor vitals.  Has not been up with therapy yet.  Objective: Vital signs in last 24 hours: Temp:  [97.3 F (36.3 C)-98.5 F (36.9 C)] 98.2 F (36.8 C) (01/01 0520) Pulse Rate:  [64-84] 66 (01/01 0947) Resp:  [12-19] 16 (01/01 0800) BP: (84-125)/(37-66) 113/50 mmHg (01/01 0947) SpO2:  [97 %-100 %] 99 % (01/01 0800) Weight:  [62.143 kg (137 lb)] 62.143 kg (137 lb) (12/31 1457)  Intake/Output from previous day: 12/31 0701 - 01/01 0700 In: 3772.5 [P.O.:960; I.V.:2652.5; IV Piggyback:160] Out: 2850 [Urine:2700; Blood:150] Intake/Output this shift:     Recent Labs  10/14/14 0505  HGB 9.7*    Recent Labs  10/14/14 0505  WBC 11.5*  RBC 3.47*  HCT 30.3*  PLT 271    Recent Labs  10/14/14 0505  NA 136  K 4.6  CL 101  CO2 29  BUN 15  CREATININE 0.57  GLUCOSE 123*  CALCIUM 8.8   No results for input(s): LABPT, INR in the last 72 hours.  Sensation intact distally Intact pulses distally Dorsiflexion/Plantar flexion intact Incision: dressing C/D/I  Assessment/Plan: 1 Day Post-Op Procedure(s) (LRB): RIGHT TOTAL HIP ARTHROPLASTY ANTERIOR APPROACH (Right) Up with therapy  Follow H/H and vitals. Hopefully home in the next 1-2 days with HHPT.  Quantia Grullon Y 10/14/2014, 9:54 AM

## 2014-10-14 NOTE — Progress Notes (Signed)
CSW consulted for SNF placement. CSW met with pt and reviewed PN. Pt plans to return home following hospital d/c. RNCM will assist with d/c planning.  Werner Lean LCSW [phone]

## 2014-10-14 NOTE — Progress Notes (Signed)
Physical Therapy Treatment Patient Details Name: Karen Hernandez MRN: [id number] DOB: 1946-09-28 Today's Date: 10/14/2014    History of Present Illness R THR - ant dir    PT Comments    *Pt progressing well with mobility. She walked 51' with supervision and RW and performed THA exercises independently. She will likely be ready to DC home tomorrow from PT standpoint. **  Follow Up Recommendations  Home health PT     Equipment Recommendations  None recommended by PT    Recommendations for Other Services OT consult     Precautions / Restrictions Precautions Precautions: Fall Restrictions Weight Bearing Restrictions: No Other Position/Activity Restrictions: WBAT    Mobility  Bed Mobility Overal bed mobility: Needs Assistance Bed Mobility: Supine to Sit     Supine to sit: Min assist     General bed mobility comments: cues for sequence and use of L LE to self assist  Transfers Overall transfer level: Needs assistance Equipment used: Rolling walker (2 wheeled) Transfers: Sit to/from Stand Sit to Stand: Min guard         General transfer comment: cues for LE management and use of UEs to self assist  Ambulation/Gait Ambulation/Gait assistance: Min assist Ambulation Distance (Feet): 145 Feet Assistive device: Rolling walker (2 wheeled) Gait Pattern/deviations: Step-to pattern;Decreased step length - left;Decreased step length - right   Gait velocity interpretation: Below normal speed for age/gender General Gait Details: cues for posture, position from RW and initial sequence   Stairs            Wheelchair Mobility    Modified Rankin (Stroke Patients Only)       Balance                                    Cognition Arousal/Alertness: Awake/alert Behavior During Therapy: WFL for tasks assessed/performed Overall Cognitive Status: Within Functional Limits for tasks assessed                      Exercises Total Joint  Exercises Ankle Circles/Pumps: AROM;Both;15 reps;Supine Quad Sets: AROM;Both;10 reps;Supine Heel Slides: AAROM;15 reps;Supine;Right Hip ABduction/ADduction: Right;10 reps;Standing;AROM Long Arc Quad: AROM;Right;15 reps;Seated Marching in Standing: AROM;Right;10 reps;Standing Standing Hip Extension: AROM;Right;10 reps;Standing    General Comments        Pertinent Vitals/Pain Pain Assessment: 0-10 Pain Score: 2  Pain Location: R hip Pain Descriptors / Indicators: Sore Pain Intervention(s): Limited activity within patient's tolerance;Monitored during session;Premedicated before session;Ice applied    Home Living Family/patient expects to be discharged to:: Private residence Living Arrangements: Alone Available Help at Discharge: Friend(s) Type of Home: House Home Access: Stairs to enter Entrance Stairs-Rails: Right Home Layout: One level Home Equipment: Environmental consultant - 2 wheels;Cane - single point;Grab bars - toilet;Shower seat;Shower seat - built in;Toilet riser Additional Comments: pt states she has a Sports administrator and thinks she has a shower chair or can get one.    Prior Function Level of Independence: Independent with assistive device(s);Independent          PT Goals (current goals can now be found in the care plan section) Acute Rehab PT Goals Patient Stated Goal: Resume previous active livestyle, doing yardwork and working as hairdresser PT Goal Formulation: With patient Time For Goal Achievement: 10/28/14 Potential to Achieve Goals: Good Progress towards PT goals: Progressing toward goals    Frequency  7X/week    PT Plan Current plan remains appropriate  Co-evaluation             End of Session Equipment Utilized During Treatment: Gait belt Activity Tolerance: Patient tolerated treatment well Patient left: in chair;with call bell/phone within reach;with family/visitor present     Time: 1610-9604 PT Time Calculation (min) (ACUTE ONLY): 18 min  Charges:   $Gait Training: 8-22 mins $Therapeutic Exercise: 8-22 mins                    G Codes:      Tamala Ser 10/14/2014, 2:29 PM  [phone]

## 2014-10-14 NOTE — Evaluation (Signed)
Physical Therapy Evaluation Patient Details Name: Karen Hernandez MRN: [id number] DOB: 15-Sep-1946 Today's Date: 10/14/2014   History of Present Illness  R THR - ant dir  Clinical Impression  Pt s/p R THR presents with decreased R LE strength/ROM and post op pain limiting functional mobility.  Pt should progress well to d.c home with family assist and HHPT follow up.    Follow Up Recommendations Home health PT    Equipment Recommendations  None recommended by PT    Recommendations for Other Services OT consult     Precautions / Restrictions Precautions Precautions: Fall Restrictions Weight Bearing Restrictions: No Other Position/Activity Restrictions: WBAT      Mobility  Bed Mobility Overal bed mobility: Needs Assistance Bed Mobility: Supine to Sit     Supine to sit: Min assist     General bed mobility comments: cues for sequence and use of L LE to self assist  Transfers Overall transfer level: Needs assistance Equipment used: Rolling walker (2 wheeled) Transfers: Sit to/from Stand Sit to Stand: Min assist         General transfer comment: cues for LE management and use of UEs to self assist  Ambulation/Gait Ambulation/Gait assistance: Min assist Ambulation Distance (Feet): 70 Feet Assistive device: Rolling walker (2 wheeled) Gait Pattern/deviations: Step-to pattern;Decreased step length - right;Decreased step length - left;Shuffle     General Gait Details: cues for posture, position from RW and initial sequence  Stairs            Wheelchair Mobility    Modified Rankin (Stroke Patients Only)       Balance                                             Pertinent Vitals/Pain Pain Assessment: 0-10 Pain Score: 3  Pain Location: R hip Pain Descriptors / Indicators: Aching;Burning Pain Intervention(s): Limited activity within patient's tolerance;Monitored during session;Premedicated before session;Ice applied    Home Living  Family/patient expects to be discharged to:: Private residence Living Arrangements: Alone Available Help at Discharge: Friend(s) Type of Home: House Home Access: Stairs to enter Entrance Stairs-Rails: Right Entrance Stairs-Number of Steps: 4 Home Layout: One level Home Equipment: Walker - 2 wheels;Cane - single point      Prior Function Level of Independence: Independent with assistive device(s);Independent               Hand Dominance   Dominant Hand: Right    Extremity/Trunk Assessment   Upper Extremity Assessment: Overall WFL for tasks assessed           Lower Extremity Assessment: RLE deficits/detail RLE Deficits / Details: 3-/5 hip strength with AAROM at hip to 90 flex and 20 abd    Cervical / Trunk Assessment: Normal  Communication   Communication: No difficulties  Cognition Arousal/Alertness: Awake/alert Behavior During Therapy: WFL for tasks assessed/performed Overall Cognitive Status: Within Functional Limits for tasks assessed                      General Comments      Exercises Total Joint Exercises Ankle Circles/Pumps: AROM;Both;15 reps;Supine Quad Sets: AROM;Both;10 reps;Supine Heel Slides: AAROM;15 reps;Supine;Right Hip ABduction/ADduction: AAROM;Right;10 reps;Supine      Assessment/Plan    PT Assessment Patient needs continued PT services  PT Diagnosis Difficulty walking   PT Problem List Decreased strength;Decreased range of motion;Decreased activity tolerance;Decreased  mobility;Decreased knowledge of use of DME;Pain  PT Treatment Interventions DME instruction;Gait training;Stair training;Functional mobility training;Therapeutic activities;Therapeutic exercise;Patient/family education   PT Goals (Current goals can be found in the Care Plan section) Acute Rehab PT Goals Patient Stated Goal: Resume previous active livestyle with decreased pain PT Goal Formulation: With patient Time For Goal Achievement: 10/28/14 Potential to  Achieve Goals: Good    Frequency 7X/week   Barriers to discharge        Co-evaluation               End of Session Equipment Utilized During Treatment: Gait belt Activity Tolerance: Patient tolerated treatment well Patient left: in chair;with call bell/phone within reach;with family/visitor present Nurse Communication: Mobility status         Time: 8119-1478 PT Time Calculation (min) (ACUTE ONLY): 30 min   Charges:   PT Evaluation $Initial PT Evaluation Tier I: 1 Procedure PT Treatments $Gait Training: 8-22 mins $Therapeutic Exercise: 8-22 mins   PT G Codes:        Ferrah Panagopoulos 11/08/2014, 12:39 PM

## 2014-10-14 NOTE — Discharge Instructions (Signed)
Full weight as tolerated right hip; no hip precautions. Increase activities as comfort allows. You can get your current dressing wet daily in the shower. You can leave your current dressing in place until your outpatient follow-up. Do take an over the counter stool softener as needed up to twice daily.

## 2014-10-15 LAB — CBC
HCT: 30.2 % — ABNORMAL LOW (ref 36.0–46.0)
HEMOGLOBIN: 9.7 g/dL — AB (ref 12.0–15.0)
MCH: 28 pg (ref 26.0–34.0)
MCHC: 32.1 g/dL (ref 30.0–36.0)
MCV: 87 fL (ref 78.0–100.0)
Platelets: 245 10*3/uL (ref 150–400)
RBC: 3.47 MIL/uL — ABNORMAL LOW (ref 3.87–5.11)
RDW: 12.8 % (ref 11.5–15.5)
WBC: 8 10*3/uL (ref 4.0–10.5)

## 2014-10-15 NOTE — Discharge Summary (Signed)
Patient ID: Karen Hernandez MRN: [id number] DOB/AGE: May 22, 1946 69 y.o.  Admit date: 10/13/2014 Discharge date: 10/15/2014  Admission Diagnoses:  Principal Problem:   Arthritis of right hip Active Problems:   Status post total replacement of right hip   Discharge Diagnoses:  Same  Past Medical History  Diagnosis Date  . Family history of adverse reaction to anesthesia     "MOTHER HAD MEMORY PROBLEMS AFTER SURG"  . Arthritis   . Osteoporosis   . Constipation   . Urgency of urination   . Nocturia   . Difficulty sleeping     DUE TO PAIN    Surgeries: Procedure(s): RIGHT TOTAL HIP ARTHROPLASTY ANTERIOR APPROACH on 10/13/2014   Consultants:    Discharged Condition: Improved  Hospital Course: Karen Hernandez is an 69 y.o. female who was admitted 10/13/2014 for operative treatment ofArthritis of right hip. Patient has severe unremitting pain that affects sleep, daily activities, and work/hobbies. After pre-op clearance the patient was taken to the operating room on 10/13/2014 and underwent  Procedure(s): RIGHT TOTAL HIP ARTHROPLASTY ANTERIOR APPROACH.    Patient was given perioperative antibiotics: Anti-infectives    Start     Dose/Rate Route Frequency Ordered Stop   10/13/14 1700  ceFAZolin (ANCEF) IVPB 1 g/50 mL premix     1 g100 mL/hr over 30 Minutes Intravenous Every 6 hours 10/13/14 1512 10/13/14 2345   10/13/14 0832  ceFAZolin (ANCEF) IVPB 2 g/50 mL premix     2 g100 mL/hr over 30 Minutes Intravenous On call to O.R. 10/13/14 6213 10/13/14 1105       Patient was given sequential compression devices, early ambulation, and chemoprophylaxis to prevent DVT.  Patient benefited maximally from hospital stay and there were no complications.    Recent vital signs: Patient Vitals for the past 24 hrs:  BP Temp Temp src Pulse Resp SpO2  10/15/14 0510 (!) 143/69 mmHg 98.2 F (36.8 C) Oral 74 16 98 %  10/14/14 2204 (!) 108/55 mmHg 98.6 F (37 C) Oral 79 16 96 %  10/14/14 1706  (!) 123/52 mmHg 98.5 F (36.9 C) Oral 77 16 100 %     Recent laboratory studies:  Recent Labs  10/14/14 0505 10/15/14 0517  WBC 11.5* 8.0  HGB 9.7* 9.7*  HCT 30.3* 30.2*  PLT 271 245  NA 136  --   K 4.6  --   CL 101  --   CO2 29  --   BUN 15  --   CREATININE 0.57  --   GLUCOSE 123*  --   CALCIUM 8.8  --      Discharge Medications:     Medication List    STOP taking these medications        ALEVE 220 MG Caps  Generic drug:  Naproxen Sodium     HYDROcodone-acetaminophen 7.5-325 MG per tablet  Commonly known as:  NORCO      TAKE these medications        aspirin 325 MG EC tablet  Take 1 tablet (325 mg total) by mouth 2 (two) times daily after a meal.     DEEP BLUE RELIEF EX  Apply 1 application topically at bedtime as needed (pain).     FIBER PO  Take 3-4 each by mouth at bedtime. Chewable.     ICY HOT EX  Apply 1 application topically at bedtime as needed (pain).     methocarbamol 500 MG tablet  Commonly known as:  ROBAXIN  Take 1 tablet (500 mg  total) by mouth every 6 (six) hours as needed for muscle spasms.     methylPREDNISolone 4 MG tablet  Commonly known as:  MEDROL DOSEPAK  Take by mouth. follow package directions     multivitamin with minerals Tabs tablet  Take 1 tablet by mouth every morning.     OVER THE COUNTER MEDICATION  2-3 tablets at bedtime. Fiber force.     oxyCODONE-acetaminophen 5-325 MG per tablet  Commonly known as:  ROXICET  Take 1-2 tablets by mouth every 4 (four) hours as needed.     oxymetazoline 0.05 % nasal spray  Commonly known as:  AFRIN  Place 1 spray into both nostrils at bedtime as needed for congestion.     SALONPAS EX  Apply 1 each topically daily as needed (hip/knee pain.).     trolamine salicylate 10 % cream  Commonly known as:  ASPERCREME  Apply 1 application topically at bedtime as needed for muscle pain.     VITAMIN C PO  Take 1 tablet by mouth daily as needed (to fight off cold//sinuses).      Vitamin D 2000 UNITS Caps  Take 1 capsule by mouth every morning.        Diagnostic Studies: Dg Hip Complete Right  10/13/2014   CLINICAL DATA:  Right total hip arthroplasty.  EXAM: DG C-ARM 1-60 MIN - NRPT [facility]; PORTABLE PELVIS 1-2 VIEWS; RIGHT HIP - COMPLETE 2+ VIEW; PORTABLE RIGHT HIP - 1 VIEW  COMPARISON:  None.  FINDINGS: Fluoroscopic spot images demonstrate well seated femoral and acetabular components without complicating features. Postprocedural radiographs demonstrate the same findings.  IMPRESSION: Well seated components of a total right hip arthroplasty without complicating features.   Electronically Signed   By: Loralie Champagne M.D.   On: 10/13/2014 12:38   Dg Pelvis Portable  10/13/2014   CLINICAL DATA:  Right total hip arthroplasty.  EXAM: DG C-ARM 1-60 MIN - NRPT [facility]; PORTABLE PELVIS 1-2 VIEWS; RIGHT HIP - COMPLETE 2+ VIEW; PORTABLE RIGHT HIP - 1 VIEW  COMPARISON:  None.  FINDINGS: Fluoroscopic spot images demonstrate well seated femoral and acetabular components without complicating features. Postprocedural radiographs demonstrate the same findings.  IMPRESSION: Well seated components of a total right hip arthroplasty without complicating features.   Electronically Signed   By: Loralie Champagne M.D.   On: 10/13/2014 12:38   Dg Hip Portable 1 View Right  10/13/2014   CLINICAL DATA:  Right total hip arthroplasty.  EXAM: DG C-ARM 1-60 MIN - NRPT [facility]; PORTABLE PELVIS 1-2 VIEWS; RIGHT HIP - COMPLETE 2+ VIEW; PORTABLE RIGHT HIP - 1 VIEW  COMPARISON:  None.  FINDINGS: Fluoroscopic spot images demonstrate well seated femoral and acetabular components without complicating features. Postprocedural radiographs demonstrate the same findings.  IMPRESSION: Well seated components of a total right hip arthroplasty without complicating features.   Electronically Signed   By: Loralie Champagne M.D.   On: 10/13/2014 12:38   Dg C-arm 1-60 Min-no Report  10/13/2014   CLINICAL DATA:  Right total hip  arthroplasty.  EXAM: DG C-ARM 1-60 MIN - NRPT [facility]; PORTABLE PELVIS 1-2 VIEWS; RIGHT HIP - COMPLETE 2+ VIEW; PORTABLE RIGHT HIP - 1 VIEW  COMPARISON:  None.  FINDINGS: Fluoroscopic spot images demonstrate well seated femoral and acetabular components without complicating features. Postprocedural radiographs demonstrate the same findings.  IMPRESSION: Well seated components of a total right hip arthroplasty without complicating features.   Electronically Signed   By: Loralie Champagne M.D.   On: 10/13/2014 12:38  Disposition: to home      Discharge Instructions    Call MD / Call 911    Complete by:  As directed   If you experience chest pain or shortness of breath, CALL 911 and be transported to the hospital emergency room.  If you develope a fever above 101 F, pus (white drainage) or increased drainage or redness at the wound, or calf pain, call your surgeon's office.     Constipation Prevention    Complete by:  As directed   Drink plenty of fluids.  Prune juice may be helpful.  You may use a stool softener, such as Colace (over the counter) 100 mg twice a day.  Use MiraLax (over the counter) for constipation as needed.     Diet - low sodium heart healthy    Complete by:  As directed      Discharge patient    Complete by:  As directed      Increase activity slowly as tolerated    Complete by:  As directed            Follow-up Information    Follow up with Kathryne Hitch, MD In 2 weeks.   Specialty:  Orthopedic Surgery   Contact information:   [address] [place] [address] [phone]        Signed: Kathryne Hitch 10/15/2014, 11:48 AM

## 2014-10-15 NOTE — Progress Notes (Signed)
NCM spoke to pt and offered choice for [facility]. Pt agreeable to [facility] for HH. States she has RW and bedside commode at home. Isidoro Donning RN CCM Case Mgmt phone [phone]

## 2014-10-15 NOTE — Progress Notes (Signed)
CARE MANAGEMENT NOTE 10/15/2014  Patient:  DEVYNNE, STURDIVANT   Account Number:  [id number]  Date Initiated:  10/15/2014  Documentation initiated by:  [facility]  Subjective/Objective Assessment:   Status post total replacement of right hip     Action/Plan:   Anticipated DC Date:  10/15/2014   Anticipated DC Plan:  HOME W HOME HEALTH SERVICES      DC Planning Services  CM consult      Choice offered to / List presented to:          [facility] arranged  HH-2 PT      [facility] agency  [facility]   Status of service:  Completed, signed off Medicare Important Message given?  NA - LOS <3 / Initial given by admissions (If response is "NO", the following Medicare IM given date fields will be blank) Date Medicare IM given:   Medicare IM given by:   Date Additional Medicare IM given:   Additional Medicare IM given by:    Discharge Disposition:  HOME W HOME HEALTH SERVICES  Per UR Regulation:    If discussed at Long Length of Stay Meetings, dates discussed:    Comments:  10/15/2013 1344 NCM spoke to pt and offered choice for [facility]. Pt agreeable to [facility] for HH. Contacted [facility] for [facility]. Pt scheduled dc home today. Pt states she has a RW for home. Isidoro Donning RN CCM Case Mgmt phone [phone]

## 2014-10-15 NOTE — Progress Notes (Signed)
Occupational Therapy Treatment Patient Details Name: Karen Hernandez MRN: [id number] DOB: November 22, 1945 Today's Date: 10/15/2014    History of present illness R THR - ant dir   OT comments  All OT education completed and pt has no further OT needs at this time. OT will sign off.  Follow Up Recommendations  No OT follow up;Supervision/Assistance - 24 hour    Equipment Recommendations  None recommended by OT    Recommendations for Other Services      Precautions / Restrictions Precautions Precautions: Fall Restrictions Weight Bearing Restrictions: No Other Position/Activity Restrictions: WBAT       Mobility Bed Mobility               General bed mobility comments: OOB with nursing  Transfers Overall transfer level: Needs assistance Equipment used: Rolling walker (2 wheeled) Transfers: Sit to/from Stand Sit to Stand: Supervision         General transfer comment: verbal cues for hand placement and Le management. She needed min assist to safely descend to toilet.    Balance                                   ADL Overall ADL's : Needs assistance/impaired Eating/Feeding: Independent;Sitting   Grooming: Wash/dry hands;Supervision/safety;Standing           Upper Body Dressing : Set up;Supervision/safety;Sitting   Lower Body Dressing: Set up;Supervision/safety;Sit to/from stand   Toilet Transfer: Supervision/safety;Ambulation;Comfort height toilet;Grab bars   Toileting- Clothing Manipulation and Hygiene: Supervision/safety;Sit to/from stand   Tub/ Shower Transfer: Walk-in shower;Supervision/safety;Ambulation;Rolling walker   Functional mobility during ADLs: Supervision/safety General ADL Comments: Patient doing very well today. Got herself fully dressed with setup/S. Toileted with S. Practiced walk-in shower transfer with S. Educated to have her friends present to provide S/A as needed when she takes a shower. Reviewed LB dressing techniques.  Patient able to perform without AE. Pt is ready for discharge from an OT standpoint.      Vision                     Perception     Praxis      Cognition   Behavior During Therapy: [facility] for tasks assessed/performed Overall Cognitive Status: Within Functional Limits for tasks assessed                       Extremity/Trunk Assessment               Exercises Total Joint Exercises Ankle Circles/Pumps: AROM;Both;15 reps;Supine Quad Sets: AROM;Both;10 reps;Supine Gluteal Sets: AROM;Both;10 reps;Supine Heel Slides: AAROM;Supine;Right;20 reps Hip ABduction/ADduction: Right;Standing;AROM;15 reps   Shoulder Instructions       General Comments      Pertinent Vitals/ Pain       Pain Assessment: No/denies pain Pain Score: 3  Pain Location: R hip/thigh Pain Descriptors / Indicators: Aching;Burning Pain Intervention(s): Limited activity within patient's tolerance;Monitored during session;Premedicated before session  Home Living                                          Prior Functioning/Environment              Frequency       Progress Toward Goals  OT Goals(current goals can now be found in the care plan section)  Progress towards OT goals: Goals met/education completed, patient discharged from OT  Acute Rehab OT Goals Patient Stated Goal: return to independence  Plan All goals met and education completed, patient discharged from OT services    Co-evaluation                 End of Session Equipment Utilized During Treatment: Rolling walker   Activity Tolerance Patient tolerated treatment well   Patient Left in chair;with call bell/phone within reach   Nurse Communication          Time: 5248-1859 OT Time Calculation (min): 25 min  Charges: OT General Charges $OT Visit: 1 Procedure OT Treatments $Self Care/Home Management : 23-37 mins  Karen Hernandez A 10/15/2014, 11:42 AM

## 2014-10-15 NOTE — Progress Notes (Signed)
Instructions given to pt. Discharged from floor via w/c, belongings and family with pt. No changes in assessment. Enyla Lisbon, [employer]

## 2014-10-15 NOTE — Plan of Care (Signed)
Problem: Discharge Progression Outcomes Goal: Anticoagulant follow-up in place Outcome: Not Applicable Date Met:  03/70/96 asa

## 2014-10-15 NOTE — Progress Notes (Signed)
Patient ID: Karen Hernandez, female   DOB: [birth year], 69 y.o.   MRN: [id number] Looks very good.  Progressed very well with therapy.  Labs stable; vitals stable.  Incision clean and dry.  Can be discharged to home today.

## 2014-10-15 NOTE — Progress Notes (Signed)
Physical Therapy Treatment Patient Details Name: Karen Hernandez MRN: [id number] DOB: 09/27/1946 Today's Date: 10-27-14    History of Present Illness R THR - ant dir    PT Comments    Progressing well and very motivated.  Follow Up Recommendations  Home health PT     Equipment Recommendations  None recommended by PT    Recommendations for Other Services OT consult     Precautions / Restrictions Precautions Precautions: Fall Restrictions Weight Bearing Restrictions: No Other Position/Activity Restrictions: WBAT    Mobility  Bed Mobility               General bed mobility comments: OOB with nursing  Transfers Overall transfer level: Needs assistance Equipment used: Rolling walker (2 wheeled) Transfers: Sit to/from Stand Sit to Stand: Supervision         General transfer comment: verbal cues for hand placement and Le management. She needed min assist to safely descend to toilet.  Ambulation/Gait Ambulation/Gait assistance: Min guard;Supervision Ambulation Distance (Feet): 189 Feet Assistive device: Rolling walker (2 wheeled) Gait Pattern/deviations: Step-to pattern;Step-through pattern;Decreased step length - right;Decreased step length - left;Shuffle;Trunk flexed     General Gait Details: cues for posture, position from RW and initial sequence   Stairs Stairs: Yes Stairs assistance: Min assist Stair Management: One rail Left;Step to pattern;Forwards;With cane Number of Stairs: 8 General stair comments: cues for sequence and foot/cane placement  Wheelchair Mobility    Modified Rankin (Stroke Patients Only)       Balance                                    Cognition Arousal/Alertness: Awake/alert Behavior During Therapy: WFL for tasks assessed/performed Overall Cognitive Status: Within Functional Limits for tasks assessed                      Exercises Total Joint Exercises Ankle Circles/Pumps: AROM;Both;15  reps;Supine Quad Sets: AROM;Both;10 reps;Supine Gluteal Sets: AROM;Both;10 reps;Supine Heel Slides: AAROM;Supine;Right;20 reps Hip ABduction/ADduction: Right;Standing;AROM;15 reps    General Comments        Pertinent Vitals/Pain Pain Assessment: 0-10 Pain Score: 3  Pain Location: R hip/thigh Pain Descriptors / Indicators: Aching;Burning Pain Intervention(s): Limited activity within patient's tolerance;Monitored during session;Premedicated before session    Home Living                      Prior Function            PT Goals (current goals can now be found in the care plan section) Acute Rehab PT Goals Patient Stated Goal: return to independence PT Goal Formulation: With patient Time For Goal Achievement: 10/28/14 Potential to Achieve Goals: Good Progress towards PT goals: Progressing toward goals    Frequency  7X/week    PT Plan Current plan remains appropriate    Co-evaluation             End of Session Equipment Utilized During Treatment: Gait belt Activity Tolerance: Patient tolerated treatment well Patient left: in chair;with call bell/phone within reach     Time: 0817-0843 PT Time Calculation (min) (ACUTE ONLY): 26 min  Charges:  $Gait Training: 8-22 mins $Therapeutic Exercise: 8-22 mins                    G Codes:      Karen Hernandez 10/27/14, 10:19 AM

## 2014-10-17 ENCOUNTER — Encounter (HOSPITAL_COMMUNITY): Payer: Self-pay | Admitting: Orthopaedic Surgery

## 2015-04-21 DIAGNOSIS — M1811 Unilateral primary osteoarthritis of first carpometacarpal joint, right hand: Secondary | ICD-10-CM | POA: Insufficient documentation

## 2015-12-06 DIAGNOSIS — M7521 Bicipital tendinitis, right shoulder: Secondary | ICD-10-CM | POA: Insufficient documentation

## 2016-08-21 DIAGNOSIS — Z124 Encounter for screening for malignant neoplasm of cervix: Secondary | ICD-10-CM | POA: Insufficient documentation

## 2017-04-30 ENCOUNTER — Telehealth (INDEPENDENT_AMBULATORY_CARE_PROVIDER_SITE_OTHER): Payer: Self-pay | Admitting: Orthopaedic Surgery

## 2017-04-30 NOTE — Telephone Encounter (Signed)
PT WANTS TO KNOW HOW LONG SHE NEEDS TO CONTINUE WITH PRE-MED AT HER DENTIST APPTS.  [phone]

## 2017-04-30 NOTE — Telephone Encounter (Signed)
IC patient and advised she can d/c now.  Dr Magnus IvanBlackman does abx 3 months postop then stops.

## 2018-06-03 DIAGNOSIS — N819 Female genital prolapse, unspecified: Secondary | ICD-10-CM | POA: Insufficient documentation

## 2020-03-07 ENCOUNTER — Ambulatory Visit: Payer: Medicare HMO | Admitting: Orthopaedic Surgery

## 2020-03-08 ENCOUNTER — Ambulatory Visit: Payer: Medicare Other | Admitting: Orthopaedic Surgery

## 2020-03-08 ENCOUNTER — Other Ambulatory Visit: Payer: Self-pay

## 2020-03-08 ENCOUNTER — Ambulatory Visit: Payer: Self-pay

## 2020-03-08 DIAGNOSIS — G8929 Other chronic pain: Secondary | ICD-10-CM | POA: Diagnosis not present

## 2020-03-08 DIAGNOSIS — M1612 Unilateral primary osteoarthritis, left hip: Secondary | ICD-10-CM | POA: Diagnosis not present

## 2020-03-08 DIAGNOSIS — M25512 Pain in left shoulder: Secondary | ICD-10-CM | POA: Diagnosis not present

## 2020-03-08 DIAGNOSIS — M25511 Pain in right shoulder: Secondary | ICD-10-CM | POA: Diagnosis not present

## 2020-03-08 MED ORDER — LIDOCAINE HCL 1 % IJ SOLN
3.0000 mL | INTRAMUSCULAR | Status: AC | PRN
  Administered 2020-03-08: 3 mL

## 2020-03-08 MED ORDER — METHYLPREDNISOLONE ACETATE 40 MG/ML IJ SUSP
40.0000 mg | INTRAMUSCULAR | Status: AC | PRN
  Administered 2020-03-08: 40 mg via INTRA_ARTICULAR

## 2020-03-08 NOTE — Progress Notes (Signed)
Office Visit Note   Patient: Karen Hernandez           Date of Birth: 1946/09/23           MRN: [id number] Visit Date: 03/08/2020              Requested by: [place] [id number] [place],  [place] [id number] PCP: Guadalupe Maple, MD   Assessment & Plan: Visit Diagnoses:  1. Acute pain of right shoulder   2. Chronic left shoulder pain   3. Unilateral primary osteoarthritis, left hip     Plan: I do feel that there is worked well sending her to one of my colleagues here the office for an intra-articular steroid injection into her left hip under direct radiographic guidance.  She agrees with this as well.  I also today provide a steroid injection of the right shoulder to in the subacromial space which she tolerated very well.  All questions and concerns were answered and addressed.  I will see her back myself in about 6 weeks and by then we will see what the results are from both the right shoulder injection I provided today and hopefully the left steroid hip injection she will get in the near future.  Follow-Up Instructions: Return in about 6 weeks (around 04/19/2020).   Orders:  Orders Placed This Encounter  Procedures  . Large Joint Inj  . XR Shoulder Right  . XR HIP UNILAT W OR W/O PELVIS 1V LEFT   No orders of the defined types were placed in this encounter.     Procedures: Large Joint Inj: R subacromial bursa on 03/08/2020 2:30 PM Indications: pain and diagnostic evaluation Details: 22 G 1.5 in needle  Arthrogram: No  Medications: 3 mL lidocaine 1 %; 40 mg methylPREDNISolone acetate 40 MG/ML Outcome: tolerated well, no immediate complications Procedure, treatment alternatives, risks and benefits explained, specific risks discussed. Consent was given by the patient. Immediately prior to procedure a time out was called to verify the correct patient, procedure, equipment, support staff and site/side marked as required. Patient was prepped and draped in the usual sterile  fashion.       Clinical Data: No additional findings.   Subjective: Chief Complaint  Patient presents with  . Left Hip - Pain  . Left Knee - Pain  Patient actually comes in with chief chief complaints today.  1 is left hip pain and the other is right shoulder pain.  We actually replaced her right hip in 2015.  At that time her left hip x-ray shows a normal-appearing joint space and she is not had any problems with hip pain until over this last year.  She has had no known injury but she is very active at 74 years old.  She hurts in the groin and does radiate down to her left knee.  She is also having right shoulder pain with overhead activities.  She points to the rotator cuff area as a source of her pain just off of the acromion.  She does a lot of gardening and is hurt more after that.  Is been getting slowly worse since about March of this year.  She is not diabetic.  She is very active  HPI  Review of Systems She currently denies any headache, chest pain, shortness of breath, fever, chills, nausea, vomiting she denies any acute change in her medical status.  Objective: Vital Signs: There were no vitals taken for this visit.  Physical Exam She is alert  and orient x3 and in no acute distress.  She does not ambulate with any assistive device Ortho Exam Examination of her right hip is normal examination of her left hip shows pain in the groin with internal and external rotation.  This does radiate to the knee but the knee proper exam is normal.  Examination of her right shoulder does show evidence of impingement.  There are some weakness in the rotator cuff and pain off of the subacromial outlet and lateral shoulder.  There is not any significant limitations of motion but he can tell she is having pain past 90 degrees of abduction of the right shoulder. Specialty Comments:  No specialty comments available.  Imaging: XR HIP UNILAT W OR W/O PELVIS 1V LEFT  Result Date: 03/08/2020 A  low AP pelvis and lateral left hip shows worsening arthritis of the left hip.  These were compared to films from 2015.  There is significant joint space narrowing and sclerotic changes in the femoral head.  There is particular osteophytes that were not present before.  There is a right total hip arthroplasty that appears well-seated with no complicating features.  XR Shoulder Right  Result Date: 03/08/2020 3 views of the right shoulder show no acute findings.  There is slight    PMFS History: Patient Active Problem List   Diagnosis Date Noted  . Unilateral primary osteoarthritis, left hip 03/08/2020  . Arthritis of right hip 10/13/2014  . Status post total replacement of right hip 10/13/2014   Past Medical History:  Diagnosis Date  . Arthritis   . Constipation   . Difficulty sleeping    DUE TO PAIN  . Family history of adverse reaction to anesthesia    "MOTHER HAD MEMORY PROBLEMS AFTER SURG"  . Nocturia   . Osteoporosis   . Urgency of urination     No family history on file.  Past Surgical History:  Procedure Laterality Date  . TOTAL HIP ARTHROPLASTY Right 10/13/2014   Procedure: RIGHT TOTAL HIP ARTHROPLASTY ANTERIOR APPROACH;  Surgeon: Kathryne Hitch, MD;  Location: [facility];  Service: Orthopedics;  Laterality: Right;   Social History   Occupational History  . Not on file  Tobacco Use  . Smoking status: Never Smoker  Substance and Sexual Activity  . Alcohol use: No  . Drug use: No  . Sexual activity: Not on file

## 2020-03-09 ENCOUNTER — Other Ambulatory Visit: Payer: Self-pay

## 2020-03-09 DIAGNOSIS — M25552 Pain in left hip: Secondary | ICD-10-CM

## 2020-03-15 DIAGNOSIS — N8112 Cystocele, lateral: Secondary | ICD-10-CM | POA: Insufficient documentation

## 2020-03-22 ENCOUNTER — Telehealth: Payer: Self-pay | Admitting: Orthopaedic Surgery

## 2020-03-22 MED ORDER — HYDROCODONE-ACETAMINOPHEN 5-325 MG PO TABS: 1.0000 | ORAL_TABLET | Freq: Four times a day (QID) | ORAL | 0 refills | Status: DC | PRN

## 2020-03-22 NOTE — Telephone Encounter (Signed)
What's the status on this injection?

## 2020-03-22 NOTE — Telephone Encounter (Signed)
She is scheduled for 6/23 (that was our next available when we called her to schedule on 6/1).

## 2020-03-22 NOTE — Telephone Encounter (Signed)
I will send in a pain medication.

## 2020-03-22 NOTE — Telephone Encounter (Signed)
Pt states she is waiting for an appt with Dr. Alvester Morin on 04/05/20 and she's in a considerable amount of pain and would like to see if she could be prescribed something for her pain just until the appt.   [phone]

## 2020-03-22 NOTE — Telephone Encounter (Signed)
Pt states she is waiting for an appt with Dr. Newton on 04/05/20 and she's in a considerable amount of pain and would like to see if she could be prescribed something for her pain just until the appt.  ° °[phone] °

## 2020-03-28 ENCOUNTER — Telehealth: Payer: Self-pay | Admitting: Physical Medicine and Rehabilitation

## 2020-03-28 NOTE — Telephone Encounter (Signed)
Patient was returning our call.  Said she was just speaking with someone from our office and got disconnected. Ask that we call her house phone   Call back: [phone]

## 2020-03-28 NOTE — Telephone Encounter (Signed)
Patient called. Would like someone to call her. She can make the 3:45 on 6/17

## 2020-03-28 NOTE — Telephone Encounter (Signed)
Offered appointment 6/17 at 1545. Patient will call me back by the end of the day if she wants this appointment.

## 2020-03-28 NOTE — Telephone Encounter (Signed)
Called patient to advise that appointment has been changed.

## 2020-03-30 ENCOUNTER — Ambulatory Visit: Payer: Self-pay

## 2020-03-30 ENCOUNTER — Ambulatory Visit (INDEPENDENT_AMBULATORY_CARE_PROVIDER_SITE_OTHER): Payer: Medicare Other | Admitting: Physical Medicine and Rehabilitation

## 2020-03-30 ENCOUNTER — Other Ambulatory Visit: Payer: Self-pay

## 2020-03-30 ENCOUNTER — Encounter: Payer: Self-pay | Admitting: Physical Medicine and Rehabilitation

## 2020-03-30 DIAGNOSIS — M25552 Pain in left hip: Secondary | ICD-10-CM

## 2020-03-30 MED ORDER — BUPIVACAINE HCL 0.25 % IJ SOLN
4.0000 mL | INTRAMUSCULAR | Status: AC | PRN
Start: 2020-03-30 — End: 2020-03-30
  Administered 2020-03-30: 4 mL via INTRA_ARTICULAR

## 2020-03-30 MED ORDER — TRIAMCINOLONE ACETONIDE 40 MG/ML IJ SUSP
60.0000 mg | INTRAMUSCULAR | Status: AC | PRN
  Administered 2020-03-30: 60 mg via INTRA_ARTICULAR

## 2020-03-30 NOTE — Progress Notes (Signed)
Karen Hernandez - 74 y.o. female MRN [id number]  Date of birth: 11/11/1945  Office Visit Note: Visit Date: 03/30/2020 PCP: Judge Stall, MD Referred by: Judge Stall, MD  Subjective: No chief complaint on file.  HPI:  Karen Hernandez is a 74 y.o. female who comes in today at the request of Dr. Doneen Poisson for planned Left anesthetic hip arthrogram with fluoroscopic guidance.  The patient has failed conservative care including home exercise, medications, time and activity modification.  This injection will be diagnostic and hopefully therapeutic.  Please see requesting physician notes for further details and justification.  ROS Otherwise per HPI.  Assessment & Plan: Visit Diagnoses:  1. Pain in left hip     Plan: No additional findings.   Meds & Orders: No orders of the defined types were placed in this encounter.   Orders Placed This Encounter  Procedures  . Large Joint Inj  . XR C-ARM NO REPORT    Follow-up: No follow-ups on file.   Procedures: Large Joint Inj: L hip joint on 03/30/2020 3:40 PM Indications: pain and diagnostic evaluation Details: 22 G needle, anterior approach  Arthrogram: Yes  Medications: 4 mL bupivacaine 0.25 %; 60 mg triamcinolone acetonide 40 MG/ML Outcome: tolerated well, no immediate complications  Arthrogram demonstrated excellent flow of contrast throughout the joint surface without extravasation or obvious defect.  The patient had relief of symptoms during the anesthetic phase of the injection.  Procedure, treatment alternatives, risks and benefits explained, specific risks discussed. Consent was given by the patient. Immediately prior to procedure a time out was called to verify the correct patient, procedure, equipment, support staff and site/side marked as required. Patient was prepped and draped in the usual sterile fashion.      No notes on file   Clinical History: XR HIP UNILAT W OR W/O PELVIS 1V LEFT  Result Date:  03/08/2020 A low AP pelvis and lateral left hip shows worsening arthritis of the left hip.  These were compared to films from 2015.  There is significant joint space narrowing and sclerotic changes in the femoral head.  There is particular osteophytes that were not present before.  There is a right total hip arthroplasty that appears well-seated with no complicating features.     Objective:  VS:  HT:    WT:   BMI:     BP:   HR: bpm  TEMP: ( )  RESP:  Physical Exam Constitutional:      General: She is not in acute distress.    Appearance: Normal appearance. She is not ill-appearing.  HENT:     Head: Normocephalic and atraumatic.     Right Ear: External ear normal.     Left Ear: External ear normal.  Eyes:     Extraocular Movements: Extraocular movements intact.  Cardiovascular:     Rate and Rhythm: Normal rate.     Pulses: Normal pulses.  Musculoskeletal:     Right lower leg: No edema.     Left lower leg: No edema.     Comments: Patient has good distal strength with no pain. Painful ROM of the left hip.  Skin:    Findings: No erythema, lesion or rash.  Neurological:     General: No focal deficit present.     Mental Status: She is alert and oriented to person, place, and time.     Sensory: No sensory deficit.     Motor: No weakness or abnormal muscle tone.     Coordination:  Coordination normal.  Psychiatric:        Mood and Affect: Mood normal.        Behavior: Behavior normal.      Imaging: No results found.

## 2020-03-30 NOTE — Progress Notes (Signed)
Pt states pain in the left hip (groin pain). Pt states pain started March 2021. Any activity makes pain worse. Resting, heat, and ice packs helps with pain.   .Numeric Pain Rating Scale and Functional Assessment Average Pain 9   In the last MONTH (on 0-10 scale) has pain interfered with the following?  1. General activity like being  able to carry out your everyday physical activities such as walking, climbing stairs, carrying groceries, or moving a chair?  Rating(9)    -Dye Allergies.

## 2020-04-03 ENCOUNTER — Telehealth: Payer: Self-pay | Admitting: Orthopaedic Surgery

## 2020-04-03 NOTE — Telephone Encounter (Signed)
Patient called stating that she got an injection on 03/30/20 from Dr. Alvester Morin, but she is still having some unusual pain.  CB#[phone].  Thank you.

## 2020-04-03 NOTE — Telephone Encounter (Signed)
Do let her know that it can take a little bit longer for that injection to work since she just had it this past Thursday and its only been a few days.  However, her arthritis was worsening in that hip and it may be that it worsens enough to consider hip replacement surgery if this does not help.

## 2020-04-04 ENCOUNTER — Telehealth: Payer: Self-pay | Admitting: Orthopaedic Surgery

## 2020-04-04 MED ORDER — HYDROCODONE-ACETAMINOPHEN 5-325 MG PO TABS: 1.0000 | ORAL_TABLET | Freq: Four times a day (QID) | ORAL | 0 refills | Status: DC | PRN

## 2020-04-04 NOTE — Telephone Encounter (Signed)
Patient called requesting a call back from Dr. Magnus Ivan or nurse. Patient states she has an upcoming appointment and would like pain medication since injection hasn't kicked in yet. Patient phone number is [phone].

## 2020-04-04 NOTE — Telephone Encounter (Signed)
I sent in some hydrocodone. 

## 2020-04-04 NOTE — Telephone Encounter (Signed)
Patient aware of the below message and that Rx was called in for her

## 2020-04-05 ENCOUNTER — Ambulatory Visit: Payer: Medicare Other | Admitting: Physical Medicine and Rehabilitation

## 2020-04-19 ENCOUNTER — Encounter: Payer: Self-pay | Admitting: Orthopaedic Surgery

## 2020-04-19 ENCOUNTER — Other Ambulatory Visit: Payer: Self-pay

## 2020-04-19 ENCOUNTER — Ambulatory Visit: Payer: Medicare Other | Admitting: Orthopaedic Surgery

## 2020-04-19 DIAGNOSIS — M1612 Unilateral primary osteoarthritis, left hip: Secondary | ICD-10-CM

## 2020-04-19 MED ORDER — DICLOFENAC SODIUM 75 MG PO TBEC: 75.0000 mg | DELAYED_RELEASE_TABLET | Freq: Two times a day (BID) | ORAL | 3 refills | Status: AC | PRN

## 2020-04-19 NOTE — Progress Notes (Signed)
The patient comes in today with continued pain in her left hip.  She has known osteoarthritis of the left hip.  We actually replaced her right hip several years ago.  She is an active 74 year old female.  I recently sent her for an intra-articular steroid injection under fluoroscopy with Dr. Alvester Morin with a steroid.  This really did not help much at all.  She is also been dealing with some chronic and sometimes acute right shoulder pain.  She is very active individual and does perform an exercise routine daily as well.  She still states that she is not ready for hip replacement surgery as of yet even though she is not doing well.  She is not needed to walk with an assistive device.  On examination of her right operative hip it moves smoothly and fluidly.  Her left hip has pain with internal and external rotation.  Her previous x-rays do confirm severe arthritis of her left hip.  She does have some deficits of the rotator cuff on the right shoulder on exam as well.  At this point follow-up can be as needed.  She is not interested in hip replacement surgery as of yet but radiographically and clinically I think she is there.  She understands that if this gets to where it is bothering her enough that she wants to proceed with hip replacement surgery she will let us know.  All question concerns were answered and addressed.

## 2020-05-29 ENCOUNTER — Other Ambulatory Visit: Payer: Self-pay | Admitting: Orthopaedic Surgery

## 2020-05-29 ENCOUNTER — Telehealth: Payer: Self-pay | Admitting: Orthopaedic Surgery

## 2020-05-29 MED ORDER — ACETAMINOPHEN-CODEINE #3 300-30 MG PO TABS: 1.0000 | ORAL_TABLET | Freq: Three times a day (TID) | ORAL | 0 refills | Status: DC | PRN

## 2020-05-29 NOTE — Telephone Encounter (Signed)
I sent in some Tylenol with codeine.

## 2020-05-29 NOTE — Telephone Encounter (Signed)
Please advise 

## 2020-05-29 NOTE — Telephone Encounter (Signed)
Patient called.   She is requesting pain medication to hold her until her upcoming appointment.   Call back: [phone]

## 2020-06-07 ENCOUNTER — Ambulatory Visit: Payer: Medicare Other | Admitting: Orthopaedic Surgery

## 2020-06-07 DIAGNOSIS — M1612 Unilateral primary osteoarthritis, left hip: Secondary | ICD-10-CM | POA: Diagnosis not present

## 2020-06-07 NOTE — Progress Notes (Signed)
The patient is very well-known to me.  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  We actually replaced her right hip a few years ago and the right hip is doing very well for her.  Her left hip pain is become significantly worse with time.  She does ambulate using a cane.  She has tried hip strengthening exercises as well as activity modification.  She has had an intra-articular steroid injection in the left hip and at this point is worse enough that she does wish to proceed with total hip arthroplasty.  She understands that with the current COVID-19 pandemic that we are having to delay any surgery that involved inpatient stays.  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  She did very well with her right total hip arthroplasty.  She has had no other acute change in her medical status.  She currently denies any headache, chest pain, shortness of breath, fever, chills, nausea, vomiting.  She is not a smoker and not a diabetic.  Examination of her left hip shows significant pain and stiffness with internal and external rotation.  I cannot fully rotate the hip.  Rotation of her right operative hip is a completely normal and pain-free.  X-rays again reviewed of her left hip showing severe end-stage arthritis of her left hip with joint space narrowing and flattening of the femoral head as well as sclerotic changes and periarticular osteophytes.  At this point she does wish to have surgery scheduled hopefully in the near future.  She understands again a lot depends on bed availability due to the COVID-19 pandemic.  All questions and concerns were answered and addressed.  We will be in touch about scheduling surgery.

## 2020-06-26 ENCOUNTER — Other Ambulatory Visit: Payer: Self-pay

## 2020-06-26 ENCOUNTER — Other Ambulatory Visit: Payer: Self-pay | Admitting: Physician Assistant

## 2020-07-03 NOTE — Patient Instructions (Addendum)
DUE TO COVID-19 ONLY ONE VISITOR IS ALLOWED TO COME WITH YOU AND STAY IN THE WAITING ROOM ONLY DURING  PRE OP AND PROCEDURE.   IF YOU WILL BE ADMITTED INTO THE HOSPITAL YOU ARE ALLOWED ONE SUPPORT PERSON DURING VISITATION HOURS  ONLY (10AM -8PM)   . The support person may change daily. . The support person must pass our screening, gel in and out, and wear a mask at all times, including in the patient's room. . Patients must also wear a mask when staff or their support person are in the room.   COVID SWAB TESTING MUST BE COMPLETED ON:  Tuesday, 07-04-20 @ 11:35 AM   [address] [place], [place] [address]  (Must self quarantine after testing. Follow instructions on handout.)        Your procedure is scheduled on:  Friday, 07-07-20   Report to [facility] Main  Entrance    Report to admitting at 11:00 AM   Call this number if you have problems the morning of surgery [phone]   Do not eat food :After Midnight.   May have liquids until 10:30 AM  day of surgery  CLEAR LIQUID DIET  Foods Allowed                                                                     Foods Excluded  Water, Black Coffee and tea, regular and decaf            liquids that you cannot  Plain Jell-O in any flavor  (No red)                                   see through such as: Fruit ices (not with fruit pulp)                                      milk, soups, orange juice              Iced Popsicles (No red)                                      All solid food                                   Apple juices Sports drinks like Gatorade (No red) Lightly seasoned clear broth or consume(fat free)  Sugar, honey syrup    Complete one Ensure drink the morning of surgery at  10:30 AM  the day of surgery.     Oral Hygiene is also important to reduce your risk of infection.                                    Remember - BRUSH YOUR TEETH THE MORNING OF SURGERY WITH YOUR REGULAR TOOTHPASTE   Do NOT smoke after  Midnight   Take these  medicines the morning of surgery with A SIP OF WATER: Fexofenadine (Allegra)                                You may not have any metal on your body including hair pins, jewelry, and body piercings             Do not wear make-up, lotions, powders, perfumes/cologne, or deodorant             Do not wear nail polish.  Do not shave  48 hours prior to surgery.                 Do not bring valuables to the hospital. [facility] IS NOT  RESPONSIBLE   FOR VALUABLES.   Contacts, dentures or bridgework may not be worn into surgery.     Patients discharged the day of surgery will not be allowed to drive home.               Please read over the following fact sheets you were given: IF YOU HAVE QUESTIONS ABOUT YOUR PRE OP INSTRUCTIONS  PLEASE CALL [phone]   [place] - Preparing for Surgery Before surgery, you can play an important role.  Because skin is not sterile, your skin needs to be as free of germs as possible.  You can reduce the number of germs on your skin by washing with CHG (chlorahexidine gluconate) soap before surgery.  CHG is an antiseptic cleaner which kills germs and bonds with the skin to continue killing germs even after washing. Please DO NOT use if you have an allergy to CHG or antibacterial soaps.  If your skin becomes reddened/irritated stop using the CHG and inform your nurse when you arrive at Short Stay. Do not shave (including legs and underarms) for at least 48 hours prior to the first CHG shower.  You may shave your face/neck.  Please follow these instructions carefully:  1.  Shower with CHG Soap the night before surgery and the  morning of surgery.  2.  If you choose to wash your hair, wash your hair first as usual with your normal  shampoo.  3.  After you shampoo, rinse your hair and body thoroughly to remove the shampoo.                             4.  Use CHG as you would any other liquid soap.  You can apply chg directly to the skin and  wash.  Gently with a scrungie or clean washcloth.  5.  Apply the CHG Soap to your body ONLY FROM THE NECK DOWN.   Do   not use on face/ open                           Wound or open sores. Avoid contact with eyes, ears mouth and   genitals (private parts).                       Wash face,  Genitals (private parts) with your normal soap.             6.  Wash thoroughly, paying special attention to the area where your    surgery  will be performed.  7.  Thoroughly rinse your body with warm water from the  neck down.  8.  DO NOT shower/wash with your normal soap after using and rinsing off the CHG Soap.                9.  Pat yourself dry with a clean towel.            10.  Wear clean pajamas.            11.  Place clean sheets on your bed the night of your first shower and do not  sleep with pets. Day of Surgery : Do not apply any lotions/deodorants the morning of surgery.  Please wear clean clothes to the hospital/surgery center.  FAILURE TO FOLLOW THESE INSTRUCTIONS MAY RESULT IN THE CANCELLATION OF YOUR SURGERY  PATIENT SIGNATURE_________________________________  NURSE SIGNATURE__________________________________  ________________________________________________________________________    Karen Hernandez  An incentive spirometer is a tool that can help keep your lungs clear and active. This tool measures how well you are filling your lungs with each breath. Taking long deep breaths may help reverse or decrease the chance of developing breathing (pulmonary) problems (especially infection) following:  A long period of time when you are unable to move or be active. BEFORE THE PROCEDURE   If the spirometer includes an indicator to show your best effort, your nurse or respiratory therapist will set it to a desired goal.  If possible, sit up straight or lean slightly forward. Try not to slouch.  Hold the incentive spirometer in an upright position. INSTRUCTIONS FOR USE  1. Sit on the  edge of your bed if possible, or sit up as far as you can in bed or on a chair. 2. Hold the incentive spirometer in an upright position. 3. Breathe out normally. 4. Place the mouthpiece in your mouth and seal your lips tightly around it. 5. Breathe in slowly and as deeply as possible, raising the piston or the ball toward the top of the column. 6. Hold your breath for 3-5 seconds or for as long as possible. Allow the piston or ball to fall to the bottom of the column. 7. Remove the mouthpiece from your mouth and breathe out normally. 8. Rest for a few seconds and repeat Steps 1 through 7 at least 10 times every 1-2 hours when you are awake. Take your time and take a few normal breaths between deep breaths. 9. The spirometer may include an indicator to show your best effort. Use the indicator as a goal to work toward during each repetition. 10. After each set of 10 deep breaths, practice coughing to be sure your lungs are clear. If you have an incision (the cut made at the time of surgery), support your incision when coughing by placing a pillow or rolled up towels firmly against it. Once you are able to get out of bed, walk around indoors and cough well. You may stop using the incentive spirometer when instructed by your caregiver.  RISKS AND COMPLICATIONS  Take your time so you do not get dizzy or light-headed.  If you are in pain, you may need to take or ask for pain medication before doing incentive spirometry. It is harder to take a deep breath if you are having pain. AFTER USE  Rest and breathe slowly and easily.  It can be helpful to keep track of a log of your progress. Your caregiver can provide you with a simple table to help with this. If you are using the spirometer at home, follow these instructions: [facility] MEDICAL CARE  IF:   You are having difficultly using the spirometer.  You have trouble using the spirometer as often as instructed.  Your pain medication is not giving enough  relief while using the spirometer.  You develop fever of 100.5 F (38.1 C) or higher. SEEK IMMEDIATE MEDICAL CARE IF:   You cough up bloody sputum that had not been present before.  You develop fever of 102 F (38.9 C) or greater.  You develop worsening pain at or near the incision site. MAKE SURE YOU:   Understand these instructions.  Will watch your condition.  Will get help right away if you are not doing well or get worse. Document Released: 02/10/2007 Document Revised: 12/23/2011 Document Reviewed: 04/13/2007 Exit[employer] Patient Information 2014 [employer], [place].   ________________________________________________________________________   WHAT IS A BLOOD TRANSFUSION? Blood Transfusion Information  A transfusion is the replacement of blood or some of its parts. Blood is made up of multiple cells which provide different functions.  Red blood cells carry oxygen and are used for blood loss replacement.  White blood cells fight against infection.  Platelets control bleeding.  Plasma helps clot blood.  Other blood products are available for specialized needs, such as hemophilia or other clotting disorders. BEFORE THE TRANSFUSION  Who gives blood for transfusions?   Healthy volunteers who are fully evaluated to make sure their blood is safe. This is blood bank blood. Transfusion therapy is the safest it has ever been in the practice of medicine. Before blood is taken from a donor, a complete history is taken to make sure that person has no history of diseases nor engages in risky social behavior (examples are intravenous drug use or sexual activity with multiple partners). The donor's travel history is screened to minimize risk of transmitting infections, such as malaria. The donated blood is tested for signs of infectious diseases, such as HIV and hepatitis. The blood is then tested to be sure it is compatible with you in order to minimize the chance of a transfusion reaction.  If you or a relative donates blood, this is often done in anticipation of surgery and is not appropriate for emergency situations. It takes many days to process the donated blood. RISKS AND COMPLICATIONS Although transfusion therapy is very safe and saves many lives, the main dangers of transfusion include:   Getting an infectious disease.  Developing a transfusion reaction. This is an allergic reaction to something in the blood you were given. Every precaution is taken to prevent this. The decision to have a blood transfusion has been considered carefully by your caregiver before blood is given. Blood is not given unless the benefits outweigh the risks. AFTER THE TRANSFUSION  Right after receiving a blood transfusion, you will usually feel much better and more energetic. This is especially true if your red blood cells have gotten low (anemic). The transfusion raises the level of the red blood cells which carry oxygen, and this usually causes an energy increase.  The nurse administering the transfusion will monitor you carefully for complications. HOME CARE INSTRUCTIONS  No special instructions are needed after a transfusion. You may find your energy is better. Speak with your caregiver about any limitations on activity for underlying diseases you may have. SEEK MEDICAL CARE IF:   Your condition is not improving after your transfusion.  You develop redness or irritation at the intravenous (IV) site. SEEK IMMEDIATE MEDICAL CARE IF:  Any of the following symptoms occur over the next 12 hours:  Shaking chills.  You have a temperature by mouth above 102 F (38.9 C), not controlled by medicine.  Chest, back, or muscle pain.  People around you feel you are not acting correctly or are confused.  Shortness of breath or difficulty breathing.  Dizziness and fainting.  You get a rash or develop hives.  You have a decrease in urine output.  Your urine turns a dark color or changes to  pink, red, or brown. Any of the following symptoms occur over the next 10 days:  You have a temperature by mouth above 102 F (38.9 C), not controlled by medicine.  Shortness of breath.  Weakness after normal activity.  The white part of the eye turns yellow (jaundice).  You have a decrease in the amount of urine or are urinating less often.  Your urine turns a dark color or changes to pink, red, or brown. Document Released: 09/27/2000 Document Revised: 12/23/2011 Document Reviewed: 05/16/2008 [facility] Patient Information 2014 [place], [place].  _______________________________________________________________________

## 2020-07-03 NOTE — Progress Notes (Addendum)
COVID Vaccine Completed:  No Date COVID Vaccine completed: COVID vaccine manufacturer: Pfizer    Moderna   [employer]   PCP - Judge Stall, MD Cardiologist - N/A  Chest x-ray -  EKG -  Stress Test -  ECHO -  Cardiac Cath -  Pacemaker/ICD device last checked:  Sleep Study -  CPAP -   Fasting Blood Sugar -  Checks Blood Sugar _____ times a day  Blood Thinner Instructions: Aspirin Instructions: Last Dose:  Anesthesia review:   Patient denies shortness of breath, fever, cough and chest pain at PAT appointment   Patient verbalized understanding of instructions that were given to them at the PAT appointment. Patient was also instructed that they will need to review over the PAT instructions again at home before surgery.

## 2020-07-04 ENCOUNTER — Encounter (HOSPITAL_COMMUNITY): Payer: Self-pay

## 2020-07-04 ENCOUNTER — Other Ambulatory Visit (HOSPITAL_COMMUNITY)
Admission: RE | Admit: 2020-07-04 | Discharge: 2020-07-04 | Disposition: A | Discharge status: Home / Self Care | Payer: Medicare Other | Source: Ambulatory Visit | Attending: Orthopaedic Surgery | Admitting: Orthopaedic Surgery

## 2020-07-04 ENCOUNTER — Other Ambulatory Visit: Payer: Self-pay

## 2020-07-04 ENCOUNTER — Encounter (HOSPITAL_COMMUNITY)
Admission: RE | Admit: 2020-07-04 | Discharge: 2020-07-04 | Disposition: A | Discharge status: Home / Self Care | Payer: Medicare Other | Source: Ambulatory Visit | Attending: Orthopaedic Surgery | Admitting: Orthopaedic Surgery

## 2020-07-04 DIAGNOSIS — Z20822 Contact with and (suspected) exposure to covid-19: Secondary | ICD-10-CM | POA: Diagnosis not present

## 2020-07-04 DIAGNOSIS — Z01812 Encounter for preprocedural laboratory examination: Secondary | ICD-10-CM | POA: Diagnosis not present

## 2020-07-04 HISTORY — DX: Gastro-esophageal reflux disease without esophagitis: K21.9

## 2020-07-04 LAB — CBC
HCT: 39 % (ref 36.0–46.0)
Hemoglobin: 12.6 g/dL (ref 12.0–15.0)
MCH: 28.1 pg (ref 26.0–34.0)
MCHC: 32.3 g/dL (ref 30.0–36.0)
MCV: 87.1 fL (ref 80.0–100.0)
Platelets: 284 10*3/uL (ref 150–400)
RBC: 4.48 MIL/uL (ref 3.87–5.11)
RDW: 13 % (ref 11.5–15.5)
WBC: 6.9 10*3/uL (ref 4.0–10.5)
nRBC: 0 % (ref 0.0–0.2)

## 2020-07-04 LAB — SURGICAL PCR SCREEN
MRSA, PCR: NEGATIVE
Staphylococcus aureus: NEGATIVE

## 2020-07-04 LAB — SARS CORONAVIRUS 2 (TAT 6-24 HRS): SARS Coronavirus 2: NEGATIVE

## 2020-07-05 ENCOUNTER — Other Ambulatory Visit: Payer: Self-pay | Admitting: Orthopaedic Surgery

## 2020-07-05 ENCOUNTER — Telehealth: Payer: Self-pay | Admitting: Physician Assistant

## 2020-07-05 ENCOUNTER — Telehealth: Payer: Self-pay | Admitting: Orthopaedic Surgery

## 2020-07-05 MED ORDER — HYDROCODONE-ACETAMINOPHEN 5-325 MG PO TABS
1.0000 | ORAL_TABLET | Freq: Four times a day (QID) | ORAL | 0 refills | Status: DC | PRN
Start: 2020-07-05 — End: 2020-07-11

## 2020-07-05 MED ORDER — ASPIRIN 81 MG PO CHEW: 81.0000 mg | CHEWABLE_TABLET | Freq: Two times a day (BID) | ORAL | 0 refills | Status: AC

## 2020-07-05 MED ORDER — METHOCARBAMOL 500 MG PO TABS: 500.0000 mg | ORAL_TABLET | Freq: Four times a day (QID) | ORAL | 1 refills | Status: DC | PRN

## 2020-07-05 NOTE — Telephone Encounter (Signed)
Patient has an additional question. Patient would like a call back asking for blood work be read to her over the phone. Please call patient at [phone].

## 2020-07-05 NOTE — Telephone Encounter (Signed)
Patient called.   She is requesting a call back to discuss her medications for her upcoming surgery. She said that her operation is going to be later in the day and wants to ensure she will be able still pick up her medications afterwards  Call back: [phone]

## 2020-07-06 ENCOUNTER — Other Ambulatory Visit: Payer: Self-pay | Admitting: Orthopaedic Surgery

## 2020-07-06 MED ORDER — ONDANSETRON 4 MG PO TBDP: 4.0000 mg | ORAL_TABLET | Freq: Three times a day (TID) | ORAL | 0 refills | Status: AC | PRN

## 2020-07-06 NOTE — Anesthesia Preprocedure Evaluation (Addendum)
Anesthesia Evaluation  Patient identified by MRN, date of birth, ID band Patient awake    Reviewed: Allergy & Precautions, NPO status , Patient's Chart, lab work & pertinent test results  Airway Mallampati: II  TM Distance: >3 FB Neck ROM: Full    Dental no notable dental hx. (+) Teeth Intact, Dental Advisory Given   Pulmonary neg pulmonary ROS,    Pulmonary exam normal breath sounds clear to auscultation       Cardiovascular Exercise Tolerance: Good Normal cardiovascular exam Rhythm:Regular Rate:Normal     Neuro/Psych negative neurological ROS  negative psych ROS   GI/Hepatic Neg liver ROS, GERD  Medicated,  Endo/Other  negative endocrine ROS  Renal/GU negative Renal ROS     Musculoskeletal  (+) Arthritis ,   Abdominal   Peds  Hematology negative hematology ROS (+) Hgb 12.6 Plt 284   Anesthesia Other Findings   Reproductive/Obstetrics                            Anesthesia Physical Anesthesia Plan  ASA: II  Anesthesia Plan: Spinal   Post-op Pain Management:    Induction:   PONV Risk Score and Plan: 3 and Treatment may vary due to age or medical condition and Ondansetron  Airway Management Planned: Nasal Cannula and Natural Airway  Additional Equipment: None  Intra-op Plan:   Post-operative Plan:   Informed Consent: I have reviewed the patients History and Physical, chart, labs and discussed the procedure including the risks, benefits and alternatives for the proposed anesthesia with the patient or authorized representative who has indicated his/her understanding and acceptance.     Dental advisory given  Plan Discussed with: CRNA and Anesthesiologist  Anesthesia Plan Comments:        Anesthesia Quick Evaluation

## 2020-07-06 NOTE — H&P (Signed)
TOTAL HIP ADMISSION H&P  Patient is admitted for left total hip arthroplasty.  Subjective:  Chief Complaint: left hip pain  HPI: Karen Hernandez, 74 y.o. female, has a history of pain and functional disability in the left hip(s) due to arthritis and patient has failed non-surgical conservative treatments for greater than 12 weeks to include NSAID's and/or analgesics, corticosteriod injections, flexibility and strengthening excercises, use of assistive devices and activity modification.  Onset of symptoms was gradual starting 2 years ago with gradually worsening course since that time.The patient noted no past surgery on the left hip(s).  Patient currently rates pain in the left hip at 10 out of 10 with activity. Patient has night pain, worsening of pain with activity and weight bearing, pain that interfers with activities of daily living and pain with passive range of motion. Patient has evidence of subchondral cysts, subchondral sclerosis, periarticular osteophytes and joint space narrowing by imaging studies. This condition presents safety issues increasing the risk of falls.  There is no current active infection.  Patient Active Problem List   Diagnosis Date Noted  . Unilateral primary osteoarthritis, left hip 03/08/2020  . Arthritis of right hip 10/13/2014  . Status post total replacement of right hip 10/13/2014   Past Medical History:  Diagnosis Date  . Arthritis   . Constipation   . Difficulty sleeping    DUE TO PAIN  . Family history of adverse reaction to anesthesia    "MOTHER HAD MEMORY PROBLEMS AFTER SURG"  . GERD (gastroesophageal reflux disease)   . Nocturia   . Osteoporosis   . Urgency of urination     Past Surgical History:  Procedure Laterality Date  . TOTAL HIP ARTHROPLASTY Right 10/13/2014   Procedure: RIGHT TOTAL HIP ARTHROPLASTY ANTERIOR APPROACH;  Surgeon: Kathryne Hitch, MD;  Location: [facility];  Service: Orthopedics;  Laterality: Right;  . WISDOM TOOTH  EXTRACTION      No current facility-administered medications for this encounter.   Current Outpatient Medications  Medication Sig Dispense Refill Last Dose  . acetaminophen (TYLENOL) 650 MG CR tablet Take 650 mg by mouth every 8 (eight) hours as needed for pain.     Marland Kitchen acetaminophen-codeine (TYLENOL #3) 300-30 MG tablet Take 1-2 tablets by mouth every 8 (eight) hours as needed for moderate pain. 30 tablet 0   . b complex vitamins tablet Take 1 tablet by mouth daily.     Marland Kitchen Bioflavonoid Products (VITAMIN C) CHEW Chew 2 tablets by mouth daily.     . Carboxymethylcellul-Glycerin (LUBRICATING EYE DROPS OP) Place 1 drop into both eyes at bedtime.     . Cholecalciferol (VITAMIN D) 2000 UNITS CAPS Take 2,000 Units by mouth daily.      . diclofenac (VOLTAREN) 75 MG EC tablet Take 1 tablet (75 mg total) by mouth 2 (two) times daily between meals as needed. 60 tablet 3   . diclofenac Sodium (VOLTAREN) 1 % GEL Apply 1 application topically 4 (four) times daily as needed (pain).     Marland Kitchen diphenhydramine-acetaminophen (TYLENOL PM) 25-500 MG TABS tablet Take 1 tablet by mouth at bedtime as needed (sleep).     . fexofenadine (ALLEGRA) 180 MG tablet Take 180 mg by mouth daily as needed for allergies or rhinitis.     Marland Kitchen FIBER PO Take 3-4 each by mouth at bedtime. Chewable.     . Ibuprofen-Acetaminophen (ADVIL DUAL ACTION) 125-250 MG TABS Take 2 tablets by mouth every 4 (four) hours as needed (pain).     Marland Kitchen  Menthol-Methyl Salicylate (SALONPAS PAIN RELIEF PATCH EX) Apply 1 patch topically daily as needed (pain).     . Multiple Vitamins-Minerals (PRESERVISION AREDS 2) CAPS Take 1 capsule by mouth daily.     Marland Kitchen oxymetazoline (AFRIN) 0.05 % nasal spray Place 1 spray into both nostrils at bedtime as needed for congestion.     Marland Kitchen aspirin (ASPIRIN CHILDRENS) 81 MG chewable tablet Chew 1 tablet (81 mg total) by mouth 2 (two) times daily after a meal. 30 tablet 0   . HYDROcodone-acetaminophen (NORCO/VICODIN) 5-325 MG tablet Take  1-2 tablets by mouth every 6 (six) hours as needed for moderate pain. 30 tablet 0   . methocarbamol (ROBAXIN) 500 MG tablet Take 1 tablet (500 mg total) by mouth every 6 (six) hours as needed for muscle spasms. 40 tablet 1   . ondansetron (ZOFRAN ODT) 4 MG disintegrating tablet Take 1 tablet (4 mg total) by mouth every 8 (eight) hours as needed for nausea or vomiting. 20 tablet 0    Allergies  Allergen Reactions  . Other     Dye in the drop to dilated eyes---"Made dizzy for weeks"  . Peanuts [Peanut Oil] Itching and Swelling    Social History   Tobacco Use  . Smoking status: Never Smoker  . Smokeless tobacco: Never Used  Substance Use Topics  . Alcohol use: No    No family history on file.   Review of Systems  All other systems reviewed and are negative.   Objective:  Physical Exam Vitals reviewed.  Constitutional:      Appearance: Normal appearance.  HENT:     Head: Normocephalic and atraumatic.  Eyes:     Extraocular Movements: Extraocular movements intact.     Pupils: Pupils are equal, round, and reactive to light.  Cardiovascular:     Rate and Rhythm: Normal rate and regular rhythm.  Pulmonary:     Effort: Pulmonary effort is normal.     Breath sounds: Normal breath sounds.  Abdominal:     Palpations: Abdomen is soft.  Musculoskeletal:     Cervical back: Normal range of motion.     Left hip: Tenderness and bony tenderness present. Decreased range of motion. Decreased strength.  Neurological:     Mental Status: She is alert and oriented to person, place, and time.  Psychiatric:        Behavior: Behavior normal.     Vital signs in last 24 hours:    Labs:   Estimated body mass index is 24.16 kg/m as calculated from the following:   Height as of 07/04/20: 5\' 3"  (1.6 m).   Weight as of 07/04/20: 61.9 kg.   Imaging Review Plain radiographs demonstrate severe degenerative joint disease of the left hip(s). The bone quality appears to be good for age and  reported activity level.      Assessment/Plan:  End stage arthritis, left hip(s)  The patient history, physical examination, clinical judgement of the provider and imaging studies are consistent with end stage degenerative joint disease of the left hip(s) and total hip arthroplasty is deemed medically necessary. The treatment options including medical management, injection therapy, arthroscopy and arthroplasty were discussed at length. The risks and benefits of total hip arthroplasty were presented and reviewed. The risks due to aseptic loosening, infection, stiffness, dislocation/subluxation,  thromboembolic complications and other imponderables were discussed.  The patient acknowledged the explanation, agreed to proceed with the plan and consent was signed. Patient is being admitted for inpatient treatment for surgery, pain control,  PT, OT, prophylactic antibiotics, VTE prophylaxis, progressive ambulation and ADL's and discharge planning.The patient is planning to be discharged home with home health services

## 2020-07-07 ENCOUNTER — Ambulatory Visit (HOSPITAL_COMMUNITY): Payer: Medicare Other

## 2020-07-07 ENCOUNTER — Encounter (HOSPITAL_COMMUNITY)
Admission: RE | Disposition: A | Discharge status: Skilled Nursing Facility | Payer: Self-pay | Source: Home / Self Care | Attending: Orthopaedic Surgery

## 2020-07-07 ENCOUNTER — Other Ambulatory Visit: Payer: Self-pay

## 2020-07-07 ENCOUNTER — Ambulatory Visit (HOSPITAL_COMMUNITY): Payer: Medicare Other | Admitting: Certified Registered Nurse Anesthetist

## 2020-07-07 ENCOUNTER — Encounter (HOSPITAL_COMMUNITY): Payer: Self-pay | Admitting: Orthopaedic Surgery

## 2020-07-07 ENCOUNTER — Inpatient Hospital Stay (HOSPITAL_COMMUNITY)
Admission: RE | Admit: 2020-07-07 | Discharge: 2020-07-11 | DRG: 470 | Disposition: A | Discharge status: Skilled Nursing Facility | Payer: Medicare Other | Attending: Orthopaedic Surgery | Admitting: Orthopaedic Surgery

## 2020-07-07 DIAGNOSIS — M810 Age-related osteoporosis without current pathological fracture: Secondary | ICD-10-CM | POA: Diagnosis present

## 2020-07-07 DIAGNOSIS — Z96642 Presence of left artificial hip joint: Secondary | ICD-10-CM

## 2020-07-07 DIAGNOSIS — Z7982 Long term (current) use of aspirin: Secondary | ICD-10-CM

## 2020-07-07 DIAGNOSIS — Z419 Encounter for procedure for purposes other than remedying health state, unspecified: Secondary | ICD-10-CM

## 2020-07-07 DIAGNOSIS — M1612 Unilateral primary osteoarthritis, left hip: Secondary | ICD-10-CM | POA: Diagnosis not present

## 2020-07-07 DIAGNOSIS — Z20822 Contact with and (suspected) exposure to covid-19: Secondary | ICD-10-CM | POA: Diagnosis present

## 2020-07-07 DIAGNOSIS — Z96641 Presence of right artificial hip joint: Secondary | ICD-10-CM | POA: Diagnosis present

## 2020-07-07 HISTORY — PX: TOTAL HIP ARTHROPLASTY: SHX124

## 2020-07-07 LAB — TYPE AND SCREEN
ABO/RH(D): A POS
Antibody Screen: NEGATIVE

## 2020-07-07 SURGERY — ARTHROPLASTY, HIP, TOTAL, ANTERIOR APPROACH
Anesthesia: Spinal | Site: Hip | Laterality: Left

## 2020-07-07 MED ORDER — METOCLOPRAMIDE HCL 5 MG/ML IJ SOLN: 5.0000 mg | Freq: Three times a day (TID) | INTRAMUSCULAR | Status: DC | PRN

## 2020-07-07 MED ORDER — BUPIVACAINE IN DEXTROSE 0.75-8.25 % IT SOLN
INTRATHECAL | Status: DC | PRN
  Administered 2020-07-07: 1.6 mL via INTRATHECAL

## 2020-07-07 MED ORDER — METHOCARBAMOL 500 MG IVPB - SIMPLE MED
500.0000 mg | Freq: Four times a day (QID) | INTRAVENOUS | Status: DC | PRN
  Administered 2020-07-10: 500 mg via INTRAVENOUS
  Filled 2020-07-07: qty 500
  Filled 2020-07-07: qty 50

## 2020-07-07 MED ORDER — ONDANSETRON HCL 4 MG/2ML IJ SOLN
INTRAMUSCULAR | Status: AC
  Filled 2020-07-07: qty 2

## 2020-07-07 MED ORDER — B COMPLEX-C PO TABS
1.0000 | ORAL_TABLET | Freq: Every day | ORAL | Status: DC
  Administered 2020-07-08 – 2020-07-11 (×4): 1 via ORAL
  Filled 2020-07-07 (×4): qty 1

## 2020-07-07 MED ORDER — ONDANSETRON HCL 4 MG/2ML IJ SOLN: 4.0000 mg | Freq: Once | INTRAMUSCULAR | Status: DC | PRN

## 2020-07-07 MED ORDER — VITAMIN D 25 MCG (1000 UNIT) PO TABS
2000.0000 [IU] | ORAL_TABLET | Freq: Every day | ORAL | Status: DC
  Administered 2020-07-07 – 2020-07-11 (×5): 2000 [IU] via ORAL
  Filled 2020-07-07 (×4): qty 2

## 2020-07-07 MED ORDER — 0.9 % SODIUM CHLORIDE (POUR BTL) OPTIME
TOPICAL | Status: DC | PRN
  Administered 2020-07-07: 1000 mL

## 2020-07-07 MED ORDER — CHLORHEXIDINE GLUCONATE 0.12 % MT SOLN: 15.0000 mL | Freq: Once | OROMUCOSAL | Status: AC

## 2020-07-07 MED ORDER — CEFAZOLIN SODIUM-DEXTROSE 2-4 GM/100ML-% IV SOLN
2.0000 g | INTRAVENOUS | Status: AC
  Administered 2020-07-07: 2 g via INTRAVENOUS
  Filled 2020-07-07: qty 100

## 2020-07-07 MED ORDER — FENTANYL CITRATE (PF) 100 MCG/2ML IJ SOLN
INTRAMUSCULAR | Status: DC | PRN
Start: 2020-07-07 — End: 2020-07-07
  Administered 2020-07-07: 50 ug via INTRAVENOUS

## 2020-07-07 MED ORDER — LACTATED RINGERS IV BOLUS
250.0000 mL | Freq: Once | INTRAVENOUS | Status: AC
  Administered 2020-07-07: 250 mL via INTRAVENOUS

## 2020-07-07 MED ORDER — ACETAMINOPHEN 10 MG/ML IV SOLN
1000.0000 mg | Freq: Once | INTRAVENOUS | Status: DC | PRN
  Administered 2020-07-07: 1000 mg via INTRAVENOUS

## 2020-07-07 MED ORDER — DIPHENHYDRAMINE HCL 12.5 MG/5ML PO ELIX
12.5000 mg | ORAL_SOLUTION | ORAL | Status: DC | PRN
  Administered 2020-07-09 – 2020-07-10 (×2): 12.5 mg via ORAL
  Filled 2020-07-07 (×2): qty 5

## 2020-07-07 MED ORDER — OXYCODONE HCL 5 MG PO TABS
5.0000 mg | ORAL_TABLET | ORAL | Status: DC | PRN
  Administered 2020-07-07 (×2): 5 mg via ORAL
  Administered 2020-07-08 – 2020-07-11 (×8): 10 mg via ORAL
  Filled 2020-07-07 (×2): qty 2
  Filled 2020-07-07: qty 1
  Filled 2020-07-07 (×6): qty 2

## 2020-07-07 MED ORDER — ACETAMINOPHEN 10 MG/ML IV SOLN
INTRAVENOUS | Status: AC
  Filled 2020-07-07: qty 100

## 2020-07-07 MED ORDER — PROPOFOL 10 MG/ML IV BOLUS
INTRAVENOUS | Status: AC
  Filled 2020-07-07: qty 20

## 2020-07-07 MED ORDER — HYDROMORPHONE HCL 1 MG/ML IJ SOLN
0.5000 mg | INTRAMUSCULAR | Status: DC | PRN
  Administered 2020-07-08 – 2020-07-10 (×7): 1 mg via INTRAVENOUS
  Filled 2020-07-07 (×8): qty 1

## 2020-07-07 MED ORDER — PROPOFOL 500 MG/50ML IV EMUL
INTRAVENOUS | Status: DC | PRN
  Administered 2020-07-07: 100 ug/kg/min via INTRAVENOUS

## 2020-07-07 MED ORDER — CEFAZOLIN SODIUM-DEXTROSE 1-4 GM/50ML-% IV SOLN
1.0000 g | Freq: Once | INTRAVENOUS | Status: DC
  Filled 2020-07-07 (×2): qty 50

## 2020-07-07 MED ORDER — MIDAZOLAM HCL 5 MG/5ML IJ SOLN
INTRAMUSCULAR | Status: DC | PRN
  Administered 2020-07-07 (×2): 1 mg via INTRAVENOUS

## 2020-07-07 MED ORDER — METOCLOPRAMIDE HCL 5 MG PO TABS: 5.0000 mg | ORAL_TABLET | Freq: Three times a day (TID) | ORAL | Status: DC | PRN

## 2020-07-07 MED ORDER — LACTATED RINGERS IV BOLUS
500.0000 mL | Freq: Once | INTRAVENOUS | Status: AC
  Administered 2020-07-07: 500 mL via INTRAVENOUS

## 2020-07-07 MED ORDER — ONDANSETRON HCL 4 MG PO TABS: 4.0000 mg | ORAL_TABLET | Freq: Four times a day (QID) | ORAL | Status: DC | PRN

## 2020-07-07 MED ORDER — FENTANYL CITRATE (PF) 100 MCG/2ML IJ SOLN
25.0000 ug | INTRAMUSCULAR | Status: DC | PRN
  Administered 2020-07-07 (×3): 25 ug via INTRAVENOUS

## 2020-07-07 MED ORDER — ONDANSETRON HCL 4 MG/2ML IJ SOLN
INTRAMUSCULAR | Status: DC | PRN
  Administered 2020-07-07: 4 mg via INTRAVENOUS

## 2020-07-07 MED ORDER — DEXAMETHASONE SODIUM PHOSPHATE 10 MG/ML IJ SOLN
INTRAMUSCULAR | Status: DC | PRN
  Administered 2020-07-07: 6 mg via INTRAVENOUS

## 2020-07-07 MED ORDER — PANTOPRAZOLE SODIUM 40 MG PO TBEC
40.0000 mg | DELAYED_RELEASE_TABLET | Freq: Every day | ORAL | Status: DC
  Administered 2020-07-07 – 2020-07-11 (×5): 40 mg via ORAL
  Filled 2020-07-07 (×5): qty 1

## 2020-07-07 MED ORDER — OXYCODONE HCL 5 MG PO TABS
10.0000 mg | ORAL_TABLET | ORAL | Status: DC | PRN
  Administered 2020-07-10 – 2020-07-11 (×4): 15 mg via ORAL
  Filled 2020-07-07 (×5): qty 3

## 2020-07-07 MED ORDER — ONDANSETRON HCL 4 MG/2ML IJ SOLN: 4.0000 mg | Freq: Four times a day (QID) | INTRAMUSCULAR | Status: DC | PRN

## 2020-07-07 MED ORDER — METHOCARBAMOL 500 MG PO TABS
500.0000 mg | ORAL_TABLET | Freq: Four times a day (QID) | ORAL | Status: DC | PRN
  Administered 2020-07-07 – 2020-07-11 (×10): 500 mg via ORAL
  Filled 2020-07-07 (×9): qty 1

## 2020-07-07 MED ORDER — MIDAZOLAM HCL 2 MG/2ML IJ SOLN
INTRAMUSCULAR | Status: AC
  Filled 2020-07-07: qty 2

## 2020-07-07 MED ORDER — ALUM & MAG HYDROXIDE-SIMETH 200-200-20 MG/5ML PO SUSP: 30.0000 mL | ORAL | Status: DC | PRN

## 2020-07-07 MED ORDER — OXYCODONE HCL 5 MG PO TABS
ORAL_TABLET | ORAL | Status: AC
Start: 2020-07-07 — End: 2020-07-08
  Filled 2020-07-07: qty 1

## 2020-07-07 MED ORDER — FENTANYL CITRATE (PF) 100 MCG/2ML IJ SOLN
INTRAMUSCULAR | Status: AC
  Filled 2020-07-07: qty 2

## 2020-07-07 MED ORDER — POVIDONE-IODINE 10 % EX SWAB
2.0000 "application " | Freq: Once | CUTANEOUS | Status: AC
  Administered 2020-07-07: 2 via TOPICAL

## 2020-07-07 MED ORDER — SODIUM CHLORIDE 0.9 % IR SOLN
Status: DC | PRN
  Administered 2020-07-07: 2000 mL

## 2020-07-07 MED ORDER — PHENOL 1.4 % MT LIQD: 1.0000 | OROMUCOSAL | Status: DC | PRN

## 2020-07-07 MED ORDER — TRANEXAMIC ACID-NACL 1000-0.7 MG/100ML-% IV SOLN
1000.0000 mg | INTRAVENOUS | Status: AC
  Administered 2020-07-07: 1000 mg via INTRAVENOUS
  Filled 2020-07-07: qty 100

## 2020-07-07 MED ORDER — PHENYLEPHRINE HCL-NACL 10-0.9 MG/250ML-% IV SOLN
INTRAVENOUS | Status: DC | PRN
  Administered 2020-07-07 (×2): 15 ug/min via INTRAVENOUS

## 2020-07-07 MED ORDER — LACTATED RINGERS IV SOLN
INTRAVENOUS | Status: DC
  Administered 2020-07-07: 1 mL via INTRAVENOUS
  Administered 2020-07-07: 1000 mL via INTRAVENOUS

## 2020-07-07 MED ORDER — ACETAMINOPHEN 325 MG PO TABS
325.0000 mg | ORAL_TABLET | Freq: Four times a day (QID) | ORAL | Status: DC | PRN
  Administered 2020-07-09 – 2020-07-10 (×3): 650 mg via ORAL
  Filled 2020-07-07 (×3): qty 2

## 2020-07-07 MED ORDER — ORAL CARE MOUTH RINSE
15.0000 mL | Freq: Once | OROMUCOSAL | Status: AC
  Administered 2020-07-07: 15 mL via OROMUCOSAL

## 2020-07-07 MED ORDER — ASPIRIN 81 MG PO CHEW
81.0000 mg | CHEWABLE_TABLET | Freq: Two times a day (BID) | ORAL | Status: DC
  Administered 2020-07-07 – 2020-07-11 (×8): 81 mg via ORAL
  Filled 2020-07-07 (×8): qty 1

## 2020-07-07 MED ORDER — METHOCARBAMOL 500 MG PO TABS
ORAL_TABLET | ORAL | Status: AC
  Filled 2020-07-07: qty 1

## 2020-07-07 MED ORDER — SODIUM CHLORIDE 0.9 % IV SOLN: INTRAVENOUS | Status: DC

## 2020-07-07 MED ORDER — MENTHOL 3 MG MT LOZG: 1.0000 | LOZENGE | OROMUCOSAL | Status: DC | PRN

## 2020-07-07 MED ORDER — DEXAMETHASONE SODIUM PHOSPHATE 10 MG/ML IJ SOLN
INTRAMUSCULAR | Status: AC
  Filled 2020-07-07: qty 1

## 2020-07-07 MED ORDER — DOCUSATE SODIUM 100 MG PO CAPS
100.0000 mg | ORAL_CAPSULE | Freq: Two times a day (BID) | ORAL | Status: DC
  Administered 2020-07-07 – 2020-07-11 (×8): 100 mg via ORAL
  Filled 2020-07-07 (×8): qty 1

## 2020-07-07 SURGICAL SUPPLY — 38 items
BAG ZIPLOCK 12X15 (MISCELLANEOUS) IMPLANT
BENZOIN TINCTURE PRP APPL 2/3 (GAUZE/BANDAGES/DRESSINGS) ×2 IMPLANT
BLADE SAW SGTL 18X1.27X75 (BLADE) ×2 IMPLANT
COVER PERINEAL POST (MISCELLANEOUS) ×2 IMPLANT
COVER SURGICAL LIGHT HANDLE (MISCELLANEOUS) ×2 IMPLANT
COVER WAND RF STERILE (DRAPES) ×2 IMPLANT
CUP SECTOR GRIPTON 50MM (Cup) ×2 IMPLANT
DRAPE STERI IOBAN 125X83 (DRAPES) ×2 IMPLANT
DRAPE U-SHAPE 47X51 STRL (DRAPES) ×4 IMPLANT
DRSG AQUACEL AG ADV 3.5X10 (GAUZE/BANDAGES/DRESSINGS) ×2 IMPLANT
DURAPREP 26ML APPLICATOR (WOUND CARE) ×2 IMPLANT
ELECT REM PT RETURN 15FT ADLT (MISCELLANEOUS) ×2 IMPLANT
GAUZE XEROFORM 1X8 LF (GAUZE/BANDAGES/DRESSINGS) ×2 IMPLANT
GLOVE BIO SURGEON STRL SZ7.5 (GLOVE) ×2 IMPLANT
GLOVE BIOGEL PI IND STRL 8 (GLOVE) ×2 IMPLANT
GLOVE BIOGEL PI INDICATOR 8 (GLOVE) ×2
GLOVE ECLIPSE 8.0 STRL XLNG CF (GLOVE) ×2 IMPLANT
GOWN STRL REUS W/TWL XL LVL3 (GOWN DISPOSABLE) ×4 IMPLANT
HANDPIECE INTERPULSE COAX TIP (DISPOSABLE) ×1
HEAD FEMORAL 32 CERAMIC (Hips) ×2 IMPLANT
HOLDER FOLEY CATH W/STRAP (MISCELLANEOUS) ×2 IMPLANT
KIT TURNOVER KIT A (KITS) IMPLANT
LINER ACET PNNCL PLUS4 NEUTRAL (Hips) ×1 IMPLANT
PACK ANTERIOR HIP CUSTOM (KITS) ×2 IMPLANT
PENCIL SMOKE EVACUATOR (MISCELLANEOUS) IMPLANT
PINNACLE PLUS 4 NEUTRAL (Hips) ×2 IMPLANT
SET HNDPC FAN SPRY TIP SCT (DISPOSABLE) ×1 IMPLANT
STAPLER VISISTAT 35W (STAPLE) IMPLANT
STEM CORAIL KA11 (Stem) ×2 IMPLANT
STRIP CLOSURE SKIN 1/2X4 (GAUZE/BANDAGES/DRESSINGS) ×2 IMPLANT
SUT ETHIBOND NAB CT1 #1 30IN (SUTURE) ×2 IMPLANT
SUT ETHILON 2 0 PS N (SUTURE) IMPLANT
SUT MNCRL AB 4-0 PS2 18 (SUTURE) IMPLANT
SUT VIC AB 0 CT1 36 (SUTURE) ×2 IMPLANT
SUT VIC AB 1 CT1 36 (SUTURE) ×2 IMPLANT
SUT VIC AB 2-0 CT1 27 (SUTURE) ×2
SUT VIC AB 2-0 CT1 TAPERPNT 27 (SUTURE) ×2 IMPLANT
TRAY FOLEY MTR SLVR 16FR STAT (SET/KITS/TRAYS/PACK) IMPLANT

## 2020-07-07 NOTE — Transfer of Care (Addendum)
Immediate Anesthesia Transfer of Care Note  Patient: Karen Hernandez  Procedure(s) Performed: LEFT TOTAL HIP ARTHROPLASTY ANTERIOR APPROACH (Left Hip)  Patient Location: PACU  Anesthesia Type:Spinal  Level of Consciousness: awake, alert , oriented and patient cooperative  Airway & Oxygen Therapy: Patient Spontanous Breathing and Patient connected to face mask oxygen  Post-op Assessment: Report given to RN and Post -op Vital signs reviewed and stable  Post vital signs: stable  Last Vitals:  Vitals Value Taken Time  BP 116/53 07/07/20 1403  Temp 36.4 C 07/07/20 1403  Pulse 61 07/07/20 1415  Resp 18 07/07/20 1415  SpO2 100 % 07/07/20 1415  Vitals shown include unvalidated device data.  Last Pain:  Vitals:   07/07/20 1100  TempSrc: Oral         Complications: No complications documented.

## 2020-07-07 NOTE — Evaluation (Signed)
Physical Therapy Evaluation Patient Details Name: Karen Hernandez MRN: [id number] DOB: 03-06-1946 Today's Date: 07/07/2020   History of Present Illness  Patient is 74 y.o. female s/p Lt THA anterior approach on 07/07/20 with PMH significant for osteoporosis, GERD, OA, Rt THA in 2015.    Clinical Impression  Karen Hernandez is a 74 y.o. female POD 0 s/p Lt THA. Patient reports independence with mobility at baseline. Patient is now limited by functional impairments (see PT problem list below) and requires min assist/guard for bed mobility and transfers with RW. Patient was limited this session by symptomatic orthostatic hypotension and was unable to progress to gait training (BP dropped from 137/62 to 104/54). Patient will benefit from continued skilled PT interventions to address impairments and progress towards PLOF. Acute PT will follow to progress mobility and stair training in preparation for safe discharge home.     Follow Up Recommendations Follow surgeons recommendation for DC plan and follow-up therapies;Home health PT    Equipment Recommendations  3in1 (PT)    Recommendations for Other Services       Precautions / Restrictions Precautions Precautions: Fall Restrictions Weight Bearing Restrictions: No      Mobility  Bed Mobility Overal bed mobility: Needs Assistance Bed Mobility: Supine to Sit;Sit to Supine     Supine to sit: Supervision;HOB elevated Sit to supine: Min assist   General bed mobility comments: cues for use of gait belt to assist Lt LE out of bed. Pt able to raise trunk independently but required extra time. Pt required min assist to return to supine after feeling weak, dizzy, "funny in the head" in standing.  Transfers Overall transfer level: Needs assistance Equipment used: Rolling walker (2 wheeled) Transfers: Sit to/from Stand Sit to Stand: Supervision;Min guard         General transfer comment: cues for hand placement with RW, no assist required  for power up and pt steady in standing. pt c/o of slight dizziness that did not resolve after standing ~ 2 minutes. pt noted to be orthostatic and pt returned to supine.  Ambulation/Gait                Stairs            Wheelchair Mobility    Modified Rankin (Stroke Patients Only)       Balance Overall balance assessment: Needs assistance Sitting-balance support: Feet supported Sitting balance-Leahy Scale: Good     Standing balance support: During functional activity;Bilateral upper extremity supported Standing balance-Leahy Scale: Fair          Pertinent Vitals/Pain Pain Assessment: 0-10 Pain Score: 7  Pain Location: Lt hip Pain Descriptors / Indicators: Aching;Burning;Discomfort Pain Intervention(s): Limited activity within patient's tolerance;Monitored during session;Premedicated before session;Repositioned    Home Living Family/patient expects to be discharged to:: Private residence Living Arrangements: Alone Available Help at Discharge: Friend(s) Type of Home: House Home Access: Stairs to enter Entrance Stairs-Rails: Left Entrance Stairs-Number of Steps: 4-5 Home Layout: One level Home Equipment: Environmental consultant - 2 wheels;Cane - single point (adjustable bed frame) Additional Comments: pt's friend will stay with her to assist her as needed.    Prior Function Level of Independence: Independent               Hand Dominance   Dominant Hand: Right    Extremity/Trunk Assessment   Upper Extremity Assessment Upper Extremity Assessment: Overall WFL for tasks assessed    Lower Extremity Assessment Lower Extremity Assessment: Overall WFL for tasks assessed;LLE deficits/detail LLE Deficits /  Details: good quad activation LLE: Unable to fully assess due to pain LLE Sensation: WNL LLE Coordination: WNL    Cervical / Trunk Assessment Cervical / Trunk Assessment: Normal  Communication   Communication: No difficulties  Cognition Arousal/Alertness:  Awake/alert Behavior During Therapy: WFL for tasks assessed/performed Overall Cognitive Status: Within Functional Limits for tasks assessed                 General Comments      Exercises     Assessment/Plan    PT Assessment Patient needs continued PT services  PT Problem List Decreased strength;Decreased range of motion;Decreased activity tolerance;Decreased balance;Decreased mobility;Decreased knowledge of use of DME;Decreased knowledge of precautions;Pain       PT Treatment Interventions DME instruction;Stair training;Gait training;Functional mobility training;Therapeutic activities;Therapeutic exercise;Balance training;Patient/family education    PT Goals (Current goals can be found in the Care Plan section)  Acute Rehab PT Goals Patient Stated Goal: to stop hurting PT Goal Formulation: With patient Time For Goal Achievement: 07/14/20 Potential to Achieve Goals: Good    Frequency 7X/week   Barriers to discharge        Co-evaluation               AM-PAC PT "6 Clicks" Mobility  Outcome Measure Help needed turning from your back to your side while in a flat bed without using bedrails?: A Little Help needed moving from lying on your back to sitting on the side of a flat bed without using bedrails?: A Little Help needed moving to and from a bed to a chair (including a wheelchair)?: A Little Help needed standing up from a chair using your arms (e.g., wheelchair or bedside chair)?: A Little Help needed to walk in hospital room?: A Lot Help needed climbing 3-5 steps with a railing? : A Lot 6 Click Score: 16    End of Session Equipment Utilized During Treatment: Gait belt Activity Tolerance: Treatment limited secondary to medical complications (Comment) (orthostatic hypotension) Patient left: in bed;with family/visitor present Nurse Communication: Mobility status PT Visit Diagnosis: Muscle weakness (generalized) (M62.81);Difficulty in walking, not elsewhere  classified (R26.2);Pain Pain - Right/Left: Left Pain - part of body: Hip    Time: 8469-6295 PT Time Calculation (min) (ACUTE ONLY): 17 min   Charges:   PT Evaluation $PT Eval Low Complexity: 1 Low          Wynn Maudlin, DPT Acute Rehabilitation Services  Office [phone] Pager [phone]  07/07/2020 5:37 PM

## 2020-07-07 NOTE — Anesthesia Procedure Notes (Addendum)
Spinal  Patient location during procedure: OR End time: 07/07/2020 12:27 PM Staffing Performed: anesthesiologist and resident/CRNA  Anesthesiologist: Josephine Igo, MD Resident/CRNA: Lissa Morales, CRNA Preanesthetic Checklist Completed: patient identified, IV checked, site marked, risks and benefits discussed, surgical consent, monitors and equipment checked, pre-op evaluation and timeout performed Spinal Block Patient position: sitting Prep: DuraPrep Patient monitoring: heart rate, continuous pulse ox and blood pressure Approach: midline Location: L3-4 Injection technique: single-shot Needle Needle type: Pencan  Needle gauge: 24 G Needle length: 9 cm Needle insertion depth: 5 cm Additional Notes Expiration date of kit checked and confirmed. Patient tolerated procedure well, without complications.

## 2020-07-07 NOTE — Progress Notes (Signed)
Physical Therapy Treatment Patient Details Name: Karen Hernandez MRN: [id number] DOB: [birth year] Today's Date: 07/07/2020    History of Present Illness Patient is 74 y.o. female s/p Lt THA anterior approach on 07/07/20 with PMH significant for osteoporosis, GERD, OA, Rt THA in 2015.    PT Comments    Patient seen for additional therapy session in effort to progress mobility for safe discharge home. Pt required min guard/superivision for bed mob and transfers with RW. Pt continued to have 8/10 pain but was able to progress to ambulation. Pt walked ~ 31' with RW and min guard with cues for safe management of walker. End of gait pt c/o dizziness and noted to be hypotensive. (pt had been up including sitting at [facility], standing, and gait for ~13 minutes). Pt returned to supine and trendelenburg position and BP improved to 121/36 after 3 minutes reclined. Acute PT will continue to follow in acute setting to progress mobility as able.    Follow Up Recommendations  Follow surgeon's recommendation for DC plan and follow-up therapies;Home health PT     Equipment Recommendations  3in1 (PT)    Recommendations for Other Services       Precautions / Restrictions Precautions Precautions: Fall Restrictions Weight Bearing Restrictions: No Other Position/Activity Restrictions: WBAT    Mobility  Bed Mobility Overal bed mobility: Needs Assistance Bed Mobility: Supine to Sit;Sit to Supine     Supine to sit: Supervision;HOB elevated Sit to supine: Min assist   General bed mobility comments: pt using gait belt to assist Lt LE. supervision for sitting up to EOB. pt orthostatic at EOS and min assist required to return to supine.  Transfers Overall transfer level: Needs assistance Equipment used: Rolling walker (2 wheeled) Transfers: Sit to/from [employer] Sit to Stand: Supervision;Min guard Stand pivot transfers: Min guard       General transfer comment: pt with safe hand  placement on RW, steady in standing. Pt able to step to [facility] with min guard for safety. steady with sit<>stand from [facility].   Ambulation/Gait Ambulation/Gait assistance: Min guard Gait Distance (Feet): 65 Feet Assistive device: Rolling walker (2 wheeled) Gait Pattern/deviations: Step-to pattern;Decreased step length - left;Decreased step length - right;Decreased stride length Gait velocity: decr   General Gait Details: pt requried cues for safe step pattern and proximity to RW, assist 1x for walker positioning. No overt LOB. pt c/o "woozy headedness" during gait and returned to sit EOB. BP noted to be 100/48.    Stairs             Wheelchair Mobility    Modified Rankin (Stroke Patients Only)       Balance Overall balance assessment: Needs assistance Sitting-balance support: Feet supported Sitting balance-Leahy Scale: Good     Standing balance support: During functional activity;Bilateral upper extremity supported Standing balance-Leahy Scale: Fair             Cognition Arousal/Alertness: Awake/alert Behavior During Therapy: WFL for tasks assessed/performed Overall Cognitive Status: Within Functional Limits for tasks assessed               Exercises      General Comments        Pertinent Vitals/Pain Pain Assessment: 0-10 Pain Score: 8  Pain Location: Lt hip Pain Descriptors / Indicators: Aching;Burning;Discomfort Pain Intervention(s): Limited activity within patient's tolerance;Monitored during session;Repositioned    Home Living Family/patient expects to be discharged to:: Private residence Living Arrangements: Alone Available Help at Discharge: Friend(s) Type of Home: House Home Access:  Stairs to enter Entrance Stairs-Rails: Left Home Layout: One level Home Equipment: Walker - 2 wheels;Cane - single point (adjustable bed frame) Additional Comments: pt's friend will stay with her to assist her as needed.    Prior Function Level of Independence:  Independent          PT Goals (current goals can now be found in the care plan section) Acute Rehab PT Goals Patient Stated Goal: to stop hurting PT Goal Formulation: With patient Time For Goal Achievement: 07/14/20 Potential to Achieve Goals: Good Progress towards PT goals: Progressing toward goals    Frequency    7X/week      PT Plan Current plan remains appropriate       AM-PAC PT "6 Clicks" Mobility   Outcome Measure  Help needed turning from your back to your side while in a flat bed without using bedrails?: A Little Help needed moving from lying on your back to sitting on the side of a flat bed without using bedrails?: A Little Help needed moving to and from a bed to a chair (including a wheelchair)?: A Little Help needed standing up from a chair using your arms (e.g., wheelchair or bedside chair)?: A Little Help needed to walk in hospital room?: A Lot Help needed climbing 3-5 steps with a railing? : A Lot 6 Click Score: 16    End of Session Equipment Utilized During Treatment: Gait belt Activity Tolerance: Treatment limited secondary to medical complications (Comment) (orthostatic hypotension) Patient left: in bed;with family/visitor present Nurse Communication: Mobility status PT Visit Diagnosis: Muscle weakness (generalized) (M62.81);Difficulty in walking, not elsewhere classified (R26.2);Pain Pain - Right/Left: Left Pain - part of body: Hip     Time: 4128-7867 PT Time Calculation (min) (ACUTE ONLY): 26 min  Charges:  $Gait Training: 8-22 mins $Therapeutic Activity: 8-22 mins                     Wynn Maudlin, DPT Acute Rehabilitation Services  Office [phone] Pager [phone]  07/07/2020 6:52 PM

## 2020-07-07 NOTE — Interval H&P Note (Signed)
History and Physical Interval Note: The patient fully understands that she is scheduled today for a left total hip arthroplasty to treat her left hip osteoarthritis.  There has been no interval change in her health status.  See recent H&P.  The risk and benefits of surgery been explained in detail and informed consent is obtained.  The left hip is been marked.  07/07/2020 12:07 PM  Karen Hernandez  has presented today for surgery, with the diagnosis of osteoarthritis left hip.  The various methods of treatment have been discussed with the patient and family. After consideration of risks, benefits and other options for treatment, the patient has consented to  Procedure(s): LEFT TOTAL HIP ARTHROPLASTY ANTERIOR APPROACH (Left) as a surgical intervention.  The patient's history has been reviewed, patient examined, no change in status, stable for surgery.  I have reviewed the patient's chart and labs.  Questions were answered to the patient's satisfaction.     Kathryne Hitch

## 2020-07-07 NOTE — Op Note (Signed)
NAME: Karen Hernandez, Karen Hernandez MEDICAL RECORD [id number] ACCOUNT [id number] DATE OF BIRTH:11/23/1945 FACILITY: [facility] LOCATION: [facility] PHYSICIAN:Mandie Crabbe [name], MD  OPERATIVE REPORT  DATE OF PROCEDURE:  07/07/2020  PREOPERATIVE DIAGNOSIS:  Primary osteoarthritis and degenerative joint disease, left hip.  POSTOPERATIVE DIAGNOSIS:  Primary osteoarthritis and degenerative joint disease, left hip.  PROCEDURE:  Left total hip arthroplasty through direct anterior approach.  IMPLANTS:  [employer] acetabular component size 50, size 32+4 neutral polyethylene liner, size 11 Corail femoral component with standard offset, size 32+1 ceramic hip ball.  SURGEON:  Vanita Panda. Magnus Ivan, MD  ASSISTANT:  Richardean Canal, PA-C.  ANESTHESIA:  Spinal.  ANTIBIOTICS:  Two g IV Ancef.  ESTIMATED BLOOD LOSS:  150 mL.  COMPLICATIONS:  None.  INDICATIONS:  The patient is a very active 74 year old female well known to me.  She has debilitating arthritis involving her left hip.  At this point, she does wish to proceed with total hip arthroplasty.  We actually replaced her right hip 6 years ago  and she has done very well with that.  Having had a hip replaced before, she is fully aware of the risk of acute blood loss anemia, nerve or vessel injury, fracture, infection, dislocation, DVT and implant failure.  She understands our goals are to  decrease pain, improve mobility and overall improve quality of life.  Her clinical exam and x-ray findings are consistent with severe osteoarthritis of the left hip.  DESCRIPTION OF PROCEDURE:  After informed consent was obtained, the appropriate left hip was marked.  She was brought to the operating room and sat up on a stretcher.  Spinal anesthesia was then obtained.  She was laid in supine position on a stretcher.   Foley catheter was placed and both feet had traction boots applied to them.  Next, she was placed supine on the Hana fracture table with the  perineal post in place and both legs in line skeletal traction device and no traction applied.  Her left  operative hip was prepped and draped with DuraPrep and sterile drapes.  A time-out was called.  She was identified, correct patient, correct left hip.  I then made an incision just inferior and posterior to the anterior superior iliac spine and carried  this obliquely down the leg.  We dissected down to the tensor fascia lata muscle.  The tensor fascia was then divided longitudinally to proceed with direct anterior approach to the hip.  We identified and cauterized circumflex vessels and identified the  hip capsule, opened up the hip capsule in an L-type format, finding a moderate joint effusion and significant periarticular osteophytes around the lateral femoral head and neck.  I placed Cobra retractors within the medial and lateral joint capsule,  making our femoral neck cut above the lesser trochanter or proximal to that.  We made this cut with an oscillating saw and completed this with an osteotome.  I placed a corkscrew guide in the femoral head and removed the femoral heads entirety and found  a wide area devoid of cartilage.  I then placed a bent Hohmann over the medial acetabular rim and removed remnants of acetabular labrum and other debris.  I then began reaming under direct visualization from a size 43 reamer in step-wise increments,  going up to a size 49 with all reamers under direct visualization, the last reamer was placed under direct fluoroscopy, so we could obtain our depth of reaming, our inclination and anteversion.  I then placed the real [employer]  acetabular  component size 50 and we went with the 32+4 polyethylene liner based on the implants we put on her other side.  Attention was then turned to the femur.  With the leg externally rotated to 120 degrees, extended and adducted, we are to place a Mueller  retractor medially and a Hohmann retractor behind the greater  trochanter.  We released the lateral joint capsule and used a box-cutting osteotome to enter the femoral canal and a rongeur to lateralize.  We  then began broaching using the Corail broaching  system from a size 8 going up to a size 11.  With a size 11, we trialed a standard offset femoral neck and a 32+1 hip ball, reduced this in the acetabulum.  We were pleased with the stability on assessment.  She does have a little bit more leg length  and offset, but her stability was what we needed.  We then irrigated the soft tissue with normal saline solution using pulsatile lavage.  We dislocated the hip and removed the trial components.  We placed the real Corail femoral component, size 11 with  standard offset and the real 32+1 ceramic hip ball and again reduced this in the acetabulum and we were pleased with stability assessed mechanically and radiographically.  We then closed the joint capsule with interrupted #1 Ethibond suture, followed by  #1 Vicryl to close the tensor fascia, 0 Vicryl was used to close deep tissue and 2-0 Vicryl was used to close the subcutaneous tissue.  The skin was reapproximated with staples.  She was taken off the Hana table and taken to recovery room in stable  condition with all final counts were correct.  There were no complications noted.  Of note, Rexene Edison, PA-C, assisted in the entire case.  His assistance was crucial for facilitating all aspects of this case.  VN/NUANCE  D:07/07/2020 T:07/07/2020 JOB:[id number]

## 2020-07-07 NOTE — Discharge Instructions (Signed)

## 2020-07-07 NOTE — Progress Notes (Signed)
Office RN Case Manager assisting to help with discharge needs prior to surgery. Spoke with patient yesterday and reviewed all post-op care instructions. She is aware that she is ambulatory/same day discharge today after her Left total hip arthroplasty with Dr. Magnus Ivan. She has family that will assist at home after discharge. She has a FWW, but will need a 3in1/BSC. This has been ordered through [facility] prior to surgery. Anticipate HHPT will be needed after discharge. Referral to [facility] out of [phone]. Order and H&P sent to their office, who will be able to take referral and see patient beginning tomorrow for start of care. Please contact Ralph Dowdy, RN Case Manager for any other needs. [phone].

## 2020-07-07 NOTE — Brief Op Note (Signed)
07/07/2020  1:45 PM  PATIENT:  Karen Hernandez  74 y.o. female  PRE-OPERATIVE DIAGNOSIS:  osteoarthritis left hip  POST-OPERATIVE DIAGNOSIS:  osteoarthritis left hip  PROCEDURE:  Procedure(s): LEFT TOTAL HIP ARTHROPLASTY ANTERIOR APPROACH (Left)  SURGEON:  Surgeon(s) and Role:    Kathryne Hitch, MD - Primary  PHYSICIAN ASSISTANT:  Rexene Edison, PA-C  ANESTHESIA:   spinal  EBL:  150 mL   COUNTS:  YES  DICTATION: .Other Dictation: Dictation Number [id number]  PLAN OF CARE: Discharge to home after PACU  PATIENT DISPOSITION:  PACU - hemodynamically stable.   Delay start of Pharmacological VTE agent (>24hrs) due to surgical blood loss or risk of bleeding: no

## 2020-07-08 DIAGNOSIS — Z96642 Presence of left artificial hip joint: Secondary | ICD-10-CM

## 2020-07-08 DIAGNOSIS — M810 Age-related osteoporosis without current pathological fracture: Secondary | ICD-10-CM | POA: Diagnosis present

## 2020-07-08 DIAGNOSIS — Z96641 Presence of right artificial hip joint: Secondary | ICD-10-CM | POA: Diagnosis present

## 2020-07-08 DIAGNOSIS — Z20822 Contact with and (suspected) exposure to covid-19: Secondary | ICD-10-CM | POA: Diagnosis present

## 2020-07-08 DIAGNOSIS — M1612 Unilateral primary osteoarthritis, left hip: Secondary | ICD-10-CM | POA: Diagnosis present

## 2020-07-08 DIAGNOSIS — Z7982 Long term (current) use of aspirin: Secondary | ICD-10-CM | POA: Diagnosis not present

## 2020-07-08 LAB — BASIC METABOLIC PANEL
Anion gap: 9 (ref 5–15)
BUN: 13 mg/dL (ref 8–23)
CO2: 25 mmol/L (ref 22–32)
Calcium: 8.6 mg/dL — ABNORMAL LOW (ref 8.9–10.3)
Chloride: 98 mmol/L (ref 98–111)
Creatinine, Ser: 0.61 mg/dL (ref 0.44–1.00)
GFR calc Af Amer: >60 mL/min (ref >60)
GFR calc non Af Amer: >60 mL/min (ref >60)
Glucose, Bld: 152 mg/dL — ABNORMAL HIGH (ref 70–99)
Potassium: 4 mmol/L (ref 3.5–5.1)
Sodium: 132 mmol/L — ABNORMAL LOW (ref 135–145)

## 2020-07-08 LAB — CBC
HCT: 30.9 % — ABNORMAL LOW (ref 36.0–46.0)
Hemoglobin: 9.9 g/dL — ABNORMAL LOW (ref 12.0–15.0)
MCH: 28.4 pg (ref 26.0–34.0)
MCHC: 32 g/dL (ref 30.0–36.0)
MCV: 88.8 fL (ref 80.0–100.0)
Platelets: 200 10*3/uL (ref 150–400)
RBC: 3.48 MIL/uL — ABNORMAL LOW (ref 3.87–5.11)
RDW: 12.8 % (ref 11.5–15.5)
WBC: 11.9 10*3/uL — ABNORMAL HIGH (ref 4.0–10.5)
nRBC: 0 % (ref 0.0–0.2)

## 2020-07-08 MED ORDER — ALPRAZOLAM 0.25 MG PO TABS
0.2500 mg | ORAL_TABLET | Freq: Two times a day (BID) | ORAL | Status: DC | PRN
  Administered 2020-07-08 – 2020-07-10 (×3): 0.25 mg via ORAL
  Filled 2020-07-08 (×3): qty 1

## 2020-07-08 NOTE — Progress Notes (Signed)
Subjective: 1 Day Post-Op Procedure(s) (LRB): LEFT TOTAL HIP ARTHROPLASTY ANTERIOR APPROACH (Left) Patient reports pain as moderate.  The patient had here due to limited mobility.  Therapy has worked with her today and does feel like the full day as needed with therapy in order to discharge her safely to home tomorrow.  Objective: Vital signs in last 24 hours: Temp:  [97.5 F (36.4 C)-98.5 F (36.9 C)] 98.2 F (36.8 C) (09/25 0605) Pulse Rate:  [59-80] 66 (09/25 0531) Resp:  [14-21] 15 (09/25 0531) BP: (100-151)/(48-107) 106/57 (09/25 0605) SpO2:  [92 %-100 %] 98 % (09/25 0531) Weight:  [61.2 kg] 61.2 kg (09/24 2053)  Intake/Output from previous day: 09/24 0701 - 09/25 0700 In: 4192.5 [P.O.:540; I.V.:2652.5; IV Piggyback:1000] Out: 2650 [Urine:2500; Blood:150] Intake/Output this shift: Total I/O In: 120 [P.O.:120] Out: 450 [Urine:450]  No results for input(s): HGB in the last 72 hours. No results for input(s): WBC, RBC, HCT, PLT in the last 72 hours. No results for input(s): NA, K, CL, CO2, BUN, CREATININE, GLUCOSE, CALCIUM in the last 72 hours. No results for input(s): LABPT, INR in the last 72 hours.  Sensation intact distally Intact pulses distally Dorsiflexion/Plantar flexion intact Incision: dressing C/D/I   Assessment/Plan: 1 Day Post-Op Procedure(s) (LRB): LEFT TOTAL HIP ARTHROPLASTY ANTERIOR APPROACH (Left) Up with therapy Hopefully discharge to home tomorrow with home health PT. We will check a CBC and BMET today.     Kathryne Hitch 07/08/2020, 10:50 AM

## 2020-07-08 NOTE — Progress Notes (Signed)
Physical Therapy Treatment Patient Details Name: Karen Hernandez MRN: [id number] DOB: 01/29/1946 Today's Date: 07/08/2020    History of Present Illness Patient is 74 y.o. female s/p Lt THA anterior approach on 07/07/20 with PMH significant for osteoporosis, GERD, OA, Rt THA in 2015.    PT Comments    Pt very cooperative but requiring increased time for all tasks and limited by c/o dizziness with attempts to mobilize.   Follow Up Recommendations  Follow surgeon's recommendation for DC plan and follow-up therapies;Home health PT     Equipment Recommendations  3in1 (PT)    Recommendations for Other Services       Precautions / Restrictions Precautions Precautions: Fall Restrictions Weight Bearing Restrictions: No Other Position/Activity Restrictions: WBAT    Mobility  Bed Mobility Overal bed mobility: Needs Assistance Bed Mobility: Supine to Sit     Supine to sit: Min assist     General bed mobility comments: increased time with cues for sequence and assist to manage L LE   Transfers Overall transfer level: Needs assistance Equipment used: Rolling walker (2 wheeled) Transfers: Sit to/from [employer] Sit to Stand: Min assist;+2 safety/equipment;From elevated surface Stand pivot transfers: Min assist;+2 safety/equipment;From elevated surface       General transfer comment: cues for LE management and use of UEs to self assist.  Physical assist to bring wt up and fwd and to balance in initial standing.  Stand pvt bed to recliner with RW  Ambulation/Gait             General Gait Details: Stand pvt bed to chair only - ltd by c/o increasing dizziness   Stairs             Wheelchair Mobility    Modified Rankin (Stroke Patients Only)       Balance Overall balance assessment: Needs assistance Sitting-balance support: Feet supported Sitting balance-Leahy Scale: Good     Standing balance support: During functional activity;Bilateral  upper extremity supported Standing balance-Leahy Scale: Poor                              Cognition Arousal/Alertness: Awake/alert Behavior During Therapy: WFL for tasks assessed/performed Overall Cognitive Status: Within Functional Limits for tasks assessed                                        Exercises Total Joint Exercises Ankle Circles/Pumps: AROM;Both;15 reps;Supine Quad Sets: AROM;Both;5 reps;Supine Heel Slides: AAROM;Left;15 reps;Supine Hip ABduction/ADduction: AAROM;Left;15 reps;Supine    General Comments        Pertinent Vitals/Pain Pain Assessment: 0-10 Pain Score: 5  Pain Location: Lt hip Pain Descriptors / Indicators: Aching;Burning;Discomfort Pain Intervention(s): Limited activity within patient's tolerance;Monitored during session;Premedicated before session;Ice applied    Home Living                      Prior Function            PT Goals (current goals can now be found in the care plan section) Acute Rehab PT Goals Patient Stated Goal: Regain IND PT Goal Formulation: With patient Time For Goal Achievement: 07/14/20 Potential to Achieve Goals: Good Progress towards PT goals: Not progressing toward goals - comment (limited by dizziness with attempts to mobilize)    Frequency    7X/week      PT Plan  Current plan remains appropriate    Co-evaluation              AM-PAC PT "6 Clicks" Mobility   Outcome Measure  Help needed turning from your back to your side while in a flat bed without using bedrails?: A Little Help needed moving from lying on your back to sitting on the side of a flat bed without using bedrails?: A Little Help needed moving to and from a bed to a chair (including a wheelchair)?: A Little Help needed standing up from a chair using your arms (e.g., wheelchair or bedside chair)?: A Little Help needed to walk in hospital room?: A Lot Help needed climbing 3-5 steps with a railing? :  Total 6 Click Score: 15    End of Session Equipment Utilized During Treatment: Gait belt Activity Tolerance: Patient tolerated treatment well Patient left: with call bell/phone within reach;in chair;with chair alarm set Nurse Communication: Mobility status PT Visit Diagnosis: Muscle weakness (generalized) (M62.81);Difficulty in walking, not elsewhere classified (R26.2);Pain Pain - Right/Left: Left Pain - part of body: Hip     Time: 7408-1448 PT Time Calculation (min) (ACUTE ONLY): 18 min  Charges:  $Therapeutic Exercise: 8-22 mins $Therapeutic Activity: 8-22 mins                     Mauro Kaufmann PT Acute Rehabilitation Services Pager [phone] Office [phone]       [facility] 07/08/2020, 12:47 PM

## 2020-07-08 NOTE — Progress Notes (Signed)
Physical Therapy Treatment Patient Details Name: Karen Hernandez MRN: [id number] DOB: 07-Jul-1946 Today's Date: 07/08/2020    History of Present Illness Patient is 74 y.o. female s/p Lt THA anterior approach on 07/07/20 with PMH significant for osteoporosis, GERD, OA, Rt THA in 2015.    PT Comments    Pt very cooperative and with noted improved pain control following IV pain meds.  Therex program performed with assist.  Will return with assist to progress to OOB.   Follow Up Recommendations  Follow surgeon's recommendation for DC plan and follow-up therapies;Home health PT     Equipment Recommendations  3in1 (PT)    Recommendations for Other Services       Precautions / Restrictions Precautions Precautions: Fall Restrictions Weight Bearing Restrictions: No Other Position/Activity Restrictions: WBAT    Mobility  Bed Mobility                  Transfers                    Ambulation/Gait                 Stairs             Wheelchair Mobility    Modified Rankin (Stroke Patients Only)       Balance                                            Cognition Arousal/Alertness: Awake/alert Behavior During Therapy: WFL for tasks assessed/performed Overall Cognitive Status: Within Functional Limits for tasks assessed                                        Exercises Total Joint Exercises Ankle Circles/Pumps: AROM;Both;15 reps;Supine Quad Sets: AROM;Both;5 reps;Supine Heel Slides: AAROM;Left;15 reps;Supine Hip ABduction/ADduction: AAROM;Left;15 reps;Supine    General Comments        Pertinent Vitals/Pain Pain Assessment: 0-10 Pain Score: 5  Pain Location: Lt hip Pain Descriptors / Indicators: Aching;Burning;Discomfort Pain Intervention(s): Limited activity within patient's tolerance;Monitored during session;Premedicated before session    Home Living                      Prior Function             PT Goals (current goals can now be found in the care plan section) Acute Rehab PT Goals Patient Stated Goal: Regain IND PT Goal Formulation: With patient Time For Goal Achievement: 07/14/20 Potential to Achieve Goals: Good Progress towards PT goals: Progressing toward goals    Frequency    7X/week      PT Plan Current plan remains appropriate    Co-evaluation              AM-PAC PT "6 Clicks" Mobility   Outcome Measure  Help needed turning from your back to your side while in a flat bed without using bedrails?: A Little Help needed moving from lying on your back to sitting on the side of a flat bed without using bedrails?: A Little Help needed moving to and from a bed to a chair (including a wheelchair)?: A Little Help needed standing up from a chair using your arms (e.g., wheelchair or bedside chair)?: A Little Help needed to walk in hospital room?: A  Lot Help needed climbing 3-5 steps with a railing? : A Lot 6 Click Score: 16    End of Session   Activity Tolerance: Patient tolerated treatment well Patient left: in bed;with call bell/phone within reach Nurse Communication: Mobility status PT Visit Diagnosis: Muscle weakness (generalized) (M62.81);Difficulty in walking, not elsewhere classified (R26.2);Pain Pain - Right/Left: Left Pain - part of body: Hip     Time: 8832-5498 PT Time Calculation (min) (ACUTE ONLY): 14 min  Charges:  $Therapeutic Exercise: 8-22 mins                     Mauro Kaufmann PT Acute Rehabilitation Services Pager [phone] Office [phone]    [facility] 07/08/2020, 12:37 PM

## 2020-07-08 NOTE — TOC Transition Note (Signed)
Transition of Care [facility]) - CM/SW Discharge Note   Patient Details  Name: Karen Hernandez MRN: [id number] Date of Birth: 1946-04-06  [facility] of [facility] CM/SW Contact:  Lennart Pall, LCSW Phone Number: 07/08/2020, 10:17 AM   Clinical Narrative:    Met briefly with pt and confirmed delivery of 3n1 (in room) and she is aware [facility] is set up for HHPT.  Pt notes she had a "rough night" with poor sleep due to pain.  Feels she may need one more night in hospital as she will be returning home alone.  No further TOC needs.   Final next level of care: [place] Barriers to Discharge: No Barriers Identified   Patient Goals and CMS Choice Patient states their goals for this hospitalization and ongoing recovery are:: return home with pain more under control CMS Medicare.gov Compare Post Acute Care list provided to:: Patient Choice offered to / list presented to : Patient  Discharge Placement                       Discharge Plan and Services                DME Arranged: 3-N-1 DME Agency: [facility] Date DME Agency Contacted:  (ordered via MD office prior to surgery)     HH Arranged: PT HH Agency: [facility] Date [facility] Agency Contacted:  (ordered via MD office prior to surgery)      Social Determinants of Health (SDOH) Interventions     Readmission Risk Interventions No flowsheet data found.

## 2020-07-08 NOTE — Progress Notes (Signed)
Physical Therapy Treatment Patient Details Name: Karen Hernandez MRN: [id number] DOB: 1946-09-29 Today's Date: 07/08/2020    History of Present Illness Patient is 74 y.o. female s/p Lt THA anterior approach on 07/07/20 with PMH significant for osteoporosis, GERD, OA, Rt THA in 2015.    PT Comments    Pt up to [facility] with assist and ambulated very limited distance back to bed.  Pt cooperative but with increased time required for all activities and very limited by ongoing dizziness - BP on return to bed 86/43 - RN aware.   Follow Up Recommendations  Follow surgeon's recommendation for DC plan and follow-up therapies;Home health PT     Equipment Recommendations  3in1 (PT)    Recommendations for Other Services       Precautions / Restrictions Precautions Precautions: Fall Restrictions Weight Bearing Restrictions: No Other Position/Activity Restrictions: WBAT    Mobility  Bed Mobility Overal bed mobility: Needs Assistance Bed Mobility: Sit to Supine     Supine to sit: Min assist Sit to supine: Min assist;Mod assist;+2 for physical assistance;+2 for safety/equipment   General bed mobility comments: Increased assist 2* pt c/o increasing dizziness  Transfers Overall transfer level: Needs assistance Equipment used: Rolling walker (2 wheeled) Transfers: Sit to/from [employer] Sit to Stand: Min assist;+2 safety/equipment;From elevated surface Stand pivot transfers: Min assist;+2 safety/equipment       General transfer comment: cues for LE management and use of UEs to self assist.  Physical assist to bring wt up and fwd and to balance in initial standing.  Stand pvt recliner to [facility] with RW  Ambulation/Gait Ambulation/Gait assistance: Min assist;+2 safety/equipment Gait Distance (Feet): 5 Feet Assistive device: Rolling walker (2 wheeled) Gait Pattern/deviations: Step-to pattern;Decreased step length - left;Decreased step length - right;Decreased stride  length Gait velocity: decr   General Gait Details: cues for sequence, posture and position from RW; distance ltd by increasing dizziness   Stairs             Wheelchair Mobility    Modified Rankin (Stroke Patients Only)       Balance Overall balance assessment: Needs assistance Sitting-balance support: Feet supported Sitting balance-Leahy Scale: Good     Standing balance support: During functional activity;Bilateral upper extremity supported Standing balance-Leahy Scale: Poor                              Cognition Arousal/Alertness: Awake/alert Behavior During Therapy: WFL for tasks assessed/performed Overall Cognitive Status: Within Functional Limits for tasks assessed                                        Exercises      General Comments        Pertinent Vitals/Pain Pain Assessment: 0-10 Pain Score: 5  Pain Location: Lt hip Pain Descriptors / Indicators: Aching;Burning;Discomfort Pain Intervention(s): Limited activity within patient's tolerance;Monitored during session;Premedicated before session;Ice applied    Home Living                      Prior Function            PT Goals (current goals can now be found in the care plan section) Acute Rehab PT Goals Patient Stated Goal: Regain IND PT Goal Formulation: With patient Time For Goal Achievement: 07/14/20 Potential to Achieve Goals: Good Progress towards  PT goals: Progressing toward goals    Frequency    7X/week      PT Plan Current plan remains appropriate    Co-evaluation              AM-PAC PT "6 Clicks" Mobility   Outcome Measure  Help needed turning from your back to your side while in a flat bed without using bedrails?: A Little Help needed moving from lying on your back to sitting on the side of a flat bed without using bedrails?: A Little Help needed moving to and from a bed to a chair (including a wheelchair)?: A Little Help  needed standing up from a chair using your arms (e.g., wheelchair or bedside chair)?: A Little Help needed to walk in hospital room?: A Lot Help needed climbing 3-5 steps with a railing? : Total 6 Click Score: 15    End of Session Equipment Utilized During Treatment: Gait belt Activity Tolerance: Patient tolerated treatment well Patient left: with call bell/phone within reach;in chair;with chair alarm set Nurse Communication: Mobility status PT Visit Diagnosis: Muscle weakness (generalized) (M62.81);Difficulty in walking, not elsewhere classified (R26.2);Pain Pain - Right/Left: Left Pain - part of body: Hip     Time: 1660-6301 PT Time Calculation (min) (ACUTE ONLY): 23 min  Charges:  $Gait Training: 8-22 mins $Therapeutic Activity: 8-22 mins                     Mauro Kaufmann PT Acute Rehabilitation Services Pager [phone] Office [phone]    [name] 07/08/2020, 3:47 PM

## 2020-07-09 NOTE — Progress Notes (Signed)
Physical Therapy Treatment Patient Details Name: Karen Hernandez MRN: [id number] DOB: 09-23-1946 Today's Date: 07/09/2020    History of Present Illness Patient is 74 y.o. female s/p Lt THA anterior approach on 07/07/20 with PMH significant for osteoporosis, GERD, OA, Rt THA in 2015.    PT Comments    Pt continues cooperative but requiring increased time and progressing very slowly with mobility 2* c/o fatigue/dizziness with attempts to mobilize.  BP supine 114/52, sit 126/55, stand 136/59, after  Ambulating short distance 114/59 - RN aware.  Follow Up Recommendations  Follow surgeon's recommendation for DC plan and follow-up therapies;Home health PT     Equipment Recommendations  3in1 (PT)    Recommendations for Other Services       Precautions / Restrictions Precautions Precautions: Fall Restrictions Weight Bearing Restrictions: No Other Position/Activity Restrictions: WBAT    Mobility  Bed Mobility Overal bed mobility: Needs Assistance Bed Mobility: Supine to Sit     Supine to sit: Min assist     General bed mobility comments: Increased time with cues for sequence and min assist for L LE management and trunk control  Transfers Overall transfer level: Needs assistance Equipment used: Rolling walker (2 wheeled) Transfers: Sit to/from [employer] Sit to Stand: Min assist;+2 safety/equipment;From elevated surface Stand pivot transfers: Min assist;+2 safety/equipment       General transfer comment: cues for LE management and use of UEs to self assist.  Physical assist to bring wt up and fwd and to balance in initial standing.  Stand pvt bed to [facility] with RW  Ambulation/Gait Ambulation/Gait assistance: Min assist;+2 safety/equipment Gait Distance (Feet): 9 Feet Assistive device: Rolling walker (2 wheeled) Gait Pattern/deviations: Step-to pattern;Decreased step length - left;Decreased step length - right;Decreased stride length Gait velocity: decr    General Gait Details: cues for sequence, posture and position from RW; distance ltd by increasing dizziness   Stairs             Wheelchair Mobility    Modified Rankin (Stroke Patients Only)       Balance Overall balance assessment: Needs assistance Sitting-balance support: Feet supported Sitting balance-Leahy Scale: Good     Standing balance support: During functional activity;Bilateral upper extremity supported Standing balance-Leahy Scale: Fair                              Cognition Arousal/Alertness: Awake/alert Behavior During Therapy: WFL for tasks assessed/performed Overall Cognitive Status: Within Functional Limits for tasks assessed                                        Exercises Total Joint Exercises Ankle Circles/Pumps: AROM;Both;15 reps;Supine Quad Sets: AROM;Both;5 reps;Supine Heel Slides: AAROM;Left;15 reps;Supine Hip ABduction/ADduction: AAROM;Left;15 reps;Supine    General Comments        Pertinent Vitals/Pain Pain Assessment: 0-10 Pain Score: 8  Pain Location: Lt hip Pain Descriptors / Indicators: Aching;Burning;Discomfort Pain Intervention(s): Limited activity within patient's tolerance;Monitored during session;Premedicated before session    Home Living                      Prior Function            PT Goals (current goals can now be found in the care plan section) Acute Rehab PT Goals Patient Stated Goal: Regain IND PT Goal Formulation: With patient Time  For Goal Achievement: 07/14/20 Potential to Achieve Goals: Good Progress towards PT goals: Progressing toward goals    Frequency    7X/week      PT Plan Current plan remains appropriate    Co-evaluation              AM-PAC PT "6 Clicks" Mobility   Outcome Measure  Help needed turning from your back to your side while in a flat bed without using bedrails?: A Little Help needed moving from lying on your back to sitting on  the side of a flat bed without using bedrails?: A Little Help needed moving to and from a bed to a chair (including a wheelchair)?: A Little Help needed standing up from a chair using your arms (e.g., wheelchair or bedside chair)?: A Little Help needed to walk in hospital room?: A Lot Help needed climbing 3-5 steps with a railing? : Total 6 Click Score: 15    End of Session Equipment Utilized During Treatment: Gait belt Activity Tolerance: Patient limited by pain Patient left: in chair;with call bell/phone within reach;with chair alarm set;with nursing/sitter in room Nurse Communication: Mobility status PT Visit Diagnosis: Muscle weakness (generalized) (M62.81);Difficulty in walking, not elsewhere classified (R26.2);Pain Pain - Right/Left: Left Pain - part of body: Hip     Time: 7425-9563 PT Time Calculation (min) (ACUTE ONLY): 32 min  Charges:  $Gait Training: 8-22 mins $Therapeutic Exercise: 8-22 mins $Therapeutic Activity: 8-22 mins                     Mauro Kaufmann PT Acute Rehabilitation Services Pager [phone] Office [phone]    Garth Diffley 07/09/2020, 12:05 PM

## 2020-07-09 NOTE — Progress Notes (Signed)
Physical Therapy Treatment Patient Details Name: Karen Hernandez MRN: [id number] DOB: [birth year] Today's Date: 07/09/2020    History of Present Illness Patient is 74 y.o. female s/p Lt THA anterior approach on 07/07/20 with PMH significant for osteoporosis, GERD, OA, Rt THA in 2015.    PT Comments    Pt very cooperative but extremely limited by pain - pt requiring increased time and assist for all mobility tasks and is tearful rating pain 10/10 despite premed.  Pt assisted to bed, repositioned for comfort, provided with ice packs and RN alerted to condition.  Pt struggling to progress with mobility and may need to consider follow up rehab at SNF level prior to return home with limited assist.   Follow Up Recommendations  Follow surgeon's recommendation for DC plan and follow-up therapies;SNF     Equipment Recommendations  3in1 (PT)    Recommendations for Other Services       Precautions / Restrictions Precautions Precautions: Fall Restrictions Weight Bearing Restrictions: No Other Position/Activity Restrictions: WBAT    Mobility  Bed Mobility Overal bed mobility: Needs Assistance Bed Mobility: Sit to Supine       Sit to supine: Min assist;Mod assist;+2 for physical assistance;+2 for safety/equipment   General bed mobility comments: Increased time with cues for sequence and assist for L LE management and trunk control  Transfers Overall transfer level: Needs assistance Equipment used: Rolling walker (2 wheeled) Transfers: Sit to/from [employer] Transfers Sit to Stand: +2 safety/equipment;From elevated surface;Min assist;Mod assist Stand pivot transfers: Min assist;+2 safety/equipment          Ambulation/Gait Ambulation/Gait assistance: Min assist;+2 safety/equipment Gait Distance (Feet): 3 Feet Assistive device: Rolling walker (2 wheeled) Gait Pattern/deviations: Step-to pattern;Decreased step length - left;Decreased step length - right;Decreased stride  length Gait velocity: decr   General Gait Details: cues for sequence, posture and position from RW; distance ltd by pain   Stairs             Wheelchair Mobility    Modified Rankin (Stroke Patients Only)       Balance Overall balance assessment: Needs assistance Sitting-balance support: Feet supported Sitting balance-Leahy Scale: Good     Standing balance support: During functional activity;Bilateral upper extremity supported Standing balance-Leahy Scale: Poor                              Cognition Arousal/Alertness: Awake/alert Behavior During Therapy: WFL for tasks assessed/performed Overall Cognitive Status: Within Functional Limits for tasks assessed                                        Exercises      General Comments        Pertinent Vitals/Pain Pain Assessment: 0-10 Pain Score: 10-Worst pain ever Pain Location: Lt hip Pain Descriptors / Indicators: Aching;Burning;Grimacing;Guarding Pain Intervention(s): Limited activity within patient's tolerance;Monitored during session;Premedicated before session;Patient requesting pain meds-RN notified;Repositioned;Ice applied    Home Living                      Prior Function            PT Goals (current goals can now be found in the care plan section) Acute Rehab PT Goals Patient Stated Goal: Regain IND PT Goal Formulation: With patient Time For Goal Achievement: 07/14/20 Potential to Achieve Goals: Good Progress  towards PT goals: Not progressing toward goals - comment    Frequency    7X/week      PT Plan Discharge plan needs to be updated    Co-evaluation              AM-PAC PT "6 Clicks" Mobility   Outcome Measure  Help needed turning from your back to your side while in a flat bed without using bedrails?: A Lot Help needed moving from lying on your back to sitting on the side of a flat bed without using bedrails?: A Lot Help needed moving to  and from a bed to a chair (including a wheelchair)?: A Lot Help needed standing up from a chair using your arms (e.g., wheelchair or bedside chair)?: A Lot Help needed to walk in hospital room?: A Lot Help needed climbing 3-5 steps with a railing? : Total 6 Click Score: 11    End of Session Equipment Utilized During Treatment: Gait belt Activity Tolerance: Patient limited by pain Patient left: in bed;with call bell/phone within reach;with family/visitor present;with bed alarm set Nurse Communication: Mobility status;Patient requests pain meds PT Visit Diagnosis: Muscle weakness (generalized) (M62.81);Difficulty in walking, not elsewhere classified (R26.2);Pain Pain - Right/Left: Left Pain - part of body: Hip     Time: 9150-5697 PT Time Calculation (min) (ACUTE ONLY): 16 min  Charges:  $Gait Training: 8-22 mins                     Mauro Kaufmann PT Acute Rehabilitation Services Pager [phone] Office [phone]    Damario Gillie 07/09/2020, 4:22 PM

## 2020-07-09 NOTE — Progress Notes (Signed)
   Subjective: 2 Days Post-Op Procedure(s) (LRB): LEFT TOTAL HIP ARTHROPLASTY ANTERIOR APPROACH (Left) Patient reports pain as severe.  " I barely slept, I have 5 steps at home to get into the house. A friend will be there off and on"   Objective: Vital signs in last 24 hours: Temp:  [98.7 F (37.1 C)-99.4 F (37.4 C)] 98.7 F (37.1 C) (09/26 0534) Pulse Rate:  [75-84] 75 (09/26 0534) Resp:  [16-18] 16 (09/26 0534) BP: (100-122)/(50-56) 111/56 (09/26 0534) SpO2:  [98 %-99 %] 98 % (09/26 0534)  Intake/Output from previous day: 09/25 0701 - 09/26 0700 In: 2033 [P.O.:240; I.V.:1793] Out: 1550 [Urine:1550] Intake/Output this shift: Total I/O In: -  Out: 450 [Urine:450]  Recent Labs    07/08/20 1103  HGB 9.9*   Recent Labs    07/08/20 1103  WBC 11.9*  RBC 3.48*  HCT 30.9*  PLT 200   Recent Labs    07/08/20 1103  NA 132*  K 4.0  CL 98  CO2 25  BUN 13  CREATININE 0.61  GLUCOSE 152*  CALCIUM 8.6*   No results for input(s): LABPT, INR in the last 72 hours.  Neurologically intact No results found.  Assessment/Plan: 2 Days Post-Op Procedure(s) (LRB): LEFT TOTAL HIP ARTHROPLASTY ANTERIOR APPROACH (Left) Up with therapy, will see how she does today with PT. Will need to be safely mobile for discharge. Anxiety about pain and mobility.   Eldred Manges 07/09/2020, 8:26 AM

## 2020-07-09 NOTE — Progress Notes (Signed)
Physical Therapy Treatment Patient Details Name: Karen Hernandez MRN: [id number] DOB: 12-28-1945 Today's Date: 07/09/2020    History of Present Illness Patient is 74 y.o. female s/p Lt THA anterior approach on 07/07/20 with PMH significant for osteoporosis, GERD, OA, Rt THA in 2015.    PT Comments    Pt performed therex program with assist and multiple rests 2* pain/fatigue.  OOB deferred on arrival of bfast.   Follow Up Recommendations  Follow surgeon's recommendation for DC plan and follow-up therapies;Home health PT     Equipment Recommendations  3in1 (PT)    Recommendations for Other Services       Precautions / Restrictions Precautions Precautions: Fall Restrictions Weight Bearing Restrictions: No Other Position/Activity Restrictions: WBAT    Mobility  Bed Mobility                  Transfers                    Ambulation/Gait                 Stairs             Wheelchair Mobility    Modified Rankin (Stroke Patients Only)       Balance                                            Cognition Arousal/Alertness: Awake/alert Behavior During Therapy: WFL for tasks assessed/performed Overall Cognitive Status: Within Functional Limits for tasks assessed                                        Exercises Total Joint Exercises Ankle Circles/Pumps: AROM;Both;15 reps;Supine Quad Sets: AROM;Both;5 reps;Supine Heel Slides: AAROM;Left;15 reps;Supine Hip ABduction/ADduction: AAROM;Left;15 reps;Supine    General Comments        Pertinent Vitals/Pain Pain Assessment: 0-10 Pain Score: 8  Pain Location: Lt hip Pain Descriptors / Indicators: Aching;Burning;Discomfort Pain Intervention(s): Limited activity within patient's tolerance;Monitored during session;Premedicated before session;Ice applied    Home Living                      Prior Function            PT Goals (current goals can  now be found in the care plan section) Acute Rehab PT Goals Patient Stated Goal: Regain IND PT Goal Formulation: With patient Time For Goal Achievement: 07/14/20 Potential to Achieve Goals: Good Progress towards PT goals: Not progressing toward goals - comment (pain limited)    Frequency    7X/week      PT Plan Current plan remains appropriate    Co-evaluation              AM-PAC PT "6 Clicks" Mobility   Outcome Measure  Help needed turning from your back to your side while in a flat bed without using bedrails?: A Little Help needed moving from lying on your back to sitting on the side of a flat bed without using bedrails?: A Little Help needed moving to and from a bed to a chair (including a wheelchair)?: A Little Help needed standing up from a chair using your arms (e.g., wheelchair or bedside chair)?: A Little Help needed to walk in hospital room?: A Lot Help needed climbing  3-5 steps with a railing? : Total 6 Click Score: 15    End of Session   Activity Tolerance: Patient limited by pain Patient left: in bed;with call bell/phone within reach;with bed alarm set Nurse Communication: Mobility status PT Visit Diagnosis: Muscle weakness (generalized) (M62.81);Difficulty in walking, not elsewhere classified (R26.2);Pain Pain - Right/Left: Left Pain - part of body: Hip     Time: 1916-6060 PT Time Calculation (min) (ACUTE ONLY): 18 min  Charges:  $Therapeutic Exercise: 8-22 mins                     Mauro Kaufmann PT Acute Rehabilitation Services Pager [phone] Office [phone]    Karen Hernandez 07/09/2020, 12:01 PM

## 2020-07-09 NOTE — Anesthesia Postprocedure Evaluation (Signed)
Anesthesia Post Note  Patient: Karen Hernandez  Procedure(s) Performed: LEFT TOTAL HIP ARTHROPLASTY ANTERIOR APPROACH (Left Hip)     Anesthesia Post Evaluation No complications documented.  Last Vitals:  Vitals:   07/09/20 0534 07/09/20 1347  BP: (!) 111/56 (!) 126/53  Pulse: 75 79  Resp: 16 (!) 22  Temp: 37.1 C 37.9 C  SpO2: 98% 99%    Last Pain:  Vitals:   07/09/20 1748  TempSrc:   PainSc: 9                  Jahnyla Parrillo

## 2020-07-09 NOTE — Plan of Care (Signed)

## 2020-07-10 ENCOUNTER — Encounter (HOSPITAL_COMMUNITY): Payer: Self-pay | Admitting: Orthopaedic Surgery

## 2020-07-10 ENCOUNTER — Inpatient Hospital Stay (HOSPITAL_COMMUNITY): Payer: Medicare Other

## 2020-07-10 ENCOUNTER — Telehealth: Payer: Self-pay | Admitting: Orthopaedic Surgery

## 2020-07-10 LAB — SARS CORONAVIRUS 2 (TAT 6-24 HRS): SARS Coronavirus 2: NEGATIVE

## 2020-07-10 MED ORDER — BISACODYL 10 MG RE SUPP
10.0000 mg | Freq: Every day | RECTAL | Status: DC | PRN
  Administered 2020-07-11: 10 mg via RECTAL
  Filled 2020-07-10: qty 1

## 2020-07-10 MED ORDER — POLYETHYLENE GLYCOL 3350 17 G PO PACK
17.0000 g | PACK | Freq: Every day | ORAL | Status: DC | PRN
  Administered 2020-07-10: 17 g via ORAL
  Filled 2020-07-10: qty 1

## 2020-07-10 MED ORDER — HYDROCORTISONE 1 % EX CREA
TOPICAL_CREAM | Freq: Two times a day (BID) | CUTANEOUS | Status: DC | PRN
  Filled 2020-07-10 (×2): qty 28

## 2020-07-10 NOTE — NC FL2 (Signed)
[place] MEDICAID FL2 LEVEL OF CARE SCREENING TOOL     IDENTIFICATION  Patient Name: Karen Hernandez Birthdate: 06/21/1946 Sex: female Admission Date (Current Location): 07/07/2020  [facility] and [place] Number:  Producer, television/film/video and Address:  [facility],  [address] [place], [place] [address]      Provider Number: [id number]  Attending Physician Name and Address:  Kathryne Hitch  Relative Name and Phone Number:  [facility] Friend   [phone]    Current Level of Care: Hospital Recommended Level of Care: Skilled Nursing Facility Prior Approval Number:    Date Approved/Denied:   PASRR Number:   [id number]  Discharge Plan: SNF    Current Diagnoses: Patient Active Problem List   Diagnosis Date Noted  . Status post total replacement of left hip 07/08/2020  . Status post total hip replacement, left 07/07/2020  . Unilateral primary osteoarthritis, left hip 03/08/2020  . Arthritis of right hip 10/13/2014  . Status post total replacement of right hip 10/13/2014    Orientation RESPIRATION BLADDER Height & Weight     Self, Time, Situation, Place  Normal Continent Weight: 135 lb (61.2 kg) Height:  5\' 3"  (160 cm)  BEHAVIORAL SYMPTOMS/MOOD NEUROLOGICAL BOWEL NUTRITION STATUS      Continent Diet (Regular Diet)  AMBULATORY STATUS COMMUNICATION OF NEEDS Skin   Extensive Assist   Surgical wounds                       Personal Care Assistance Level of Assistance  Bathing, Feeding, Dressing Bathing Assistance: Maximum assistance Feeding assistance: Independent Dressing Assistance: Maximum assistance     Functional Limitations Info  Sight, Hearing, Speech Sight Info: Impaired (Wears Glasses) Hearing Info: Adequate Speech Info: Adequate    SPECIAL CARE FACTORS FREQUENCY  PT (By licensed PT), OT (By licensed OT)     PT Frequency: 5x/week OT Frequency: 5/week            Contractures Contractures Info: Not present     Additional Factors Info  Psychotropic Code Status Info: Fullcode Allergies Info: Allergies: Other, Peanuts Peanut Oil           Current Medications (07/10/2020):  This is the current hospital active medication list Current Facility-Administered Medications  Medication Dose Route Frequency Provider Last Rate Last Admin  . 0.9 %  sodium chloride infusion   Intravenous Continuous 07/12/2020, MD 75 mL/hr at 07/10/20 0131 New Bag at 07/10/20 0131  . acetaminophen (TYLENOL) tablet 325-650 mg  325-650 mg Oral Q6H PRN 07/12/20, MD   650 mg at 07/10/20 0443  . ALPRAZolam 07/12/20) tablet 0.25 mg  0.25 mg Oral BID PRN Prudy Feeler, MD   0.25 mg at 07/10/20 0419  . alum & mag hydroxide-simeth (MAALOX/MYLANTA) 200-200-20 MG/5ML suspension 30 mL  30 mL Oral Q4H PRN 04-22-2001, MD      . aspirin chewable tablet 81 mg  81 mg Oral BID Kathryne Hitch, MD   81 mg at 07/10/20 0919  . B-complex with vitamin C tablet 1 tablet  1 tablet Oral Daily 07/12/20, MD   1 tablet at 07/10/20 0920  . bisacodyl (DULCOLAX) suppository 10 mg  10 mg Rectal Daily PRN 07/12/20, MD      . ceFAZolin (ANCEF) IVPB 1 g/50 mL premix  1 g Intravenous Once Kathryne Hitch, MD      . cholecalciferol (VITAMIN D3) tablet 2,000 Units  2,000 Units Oral  Daily Kathryne Hitch, MD   2,000 Units at 07/10/20 [phone]  . diphenhydrAMINE (BENADRYL) 12.5 MG/5ML elixir 12.5-25 mg  12.5-25 mg Oral Q4H PRN Kathryne Hitch, MD   12.5 mg at 07/10/20 0921  . docusate sodium (COLACE) capsule 100 mg  100 mg Oral BID Kathryne Hitch, MD   100 mg at 07/10/20 0919  . HYDROmorphone (DILAUDID) injection 0.5-1 mg  0.5-1 mg Intravenous Q4H PRN Kathryne Hitch, MD   1 mg at 07/10/20 0815  . menthol-cetylpyridinium (CEPACOL) lozenge 3 mg  1 lozenge Oral PRN Kathryne Hitch, MD       Or  . phenol (CHLORASEPTIC) mouth spray 1 spray  1 spray  Mouth/Throat PRN Kathryne Hitch, MD      . methocarbamol (ROBAXIN) tablet 500 mg  500 mg Oral Q6H PRN Kathryne Hitch, MD   500 mg at 07/09/20 2146   Or  . methocarbamol (ROBAXIN) 500 mg in dextrose 5 % 50 mL IVPB  500 mg Intravenous Q6H PRN Kathryne Hitch, MD 100 mL/hr at 07/10/20 0445 500 mg at 07/10/20 0445  . metoCLOPramide (REGLAN) tablet 5-10 mg  5-10 mg Oral Q8H PRN Kathryne Hitch, MD       Or  . metoCLOPramide (REGLAN) injection 5-10 mg  5-10 mg Intravenous Q8H PRN Kathryne Hitch, MD      . ondansetron [facility]) tablet 4 mg  4 mg Oral Q6H PRN Kathryne Hitch, MD       Or  . ondansetron [facility]) injection 4 mg  4 mg Intravenous Q6H PRN Kathryne Hitch, MD      . oxyCODONE (Oxy IR/ROXICODONE) immediate release tablet 10-15 mg  10-15 mg Oral Q4H PRN Kathryne Hitch, MD   15 mg at 07/10/20 0129  . oxyCODONE (Oxy IR/ROXICODONE) immediate release tablet 5-10 mg  5-10 mg Oral Q4H PRN Kathryne Hitch, MD   10 mg at 07/09/20 1232  . pantoprazole (PROTONIX) EC tablet 40 mg  40 mg Oral Daily Kathryne Hitch, MD   40 mg at 07/10/20 0920  . polyethylene glycol (MIRALAX / GLYCOLAX) packet 17 g  17 g Oral Daily PRN Kathryne Hitch, MD         Discharge Medications: Please see discharge summary for a list of discharge medications.  Relevant Imaging Results:  Relevant Lab Results:   Additional Information ssn#[id number]  Clearance Coots, LCSW

## 2020-07-10 NOTE — TOC Progression Note (Addendum)
Transition of Care [facility]) - Progression Note    Patient Details  Name: Karen Hernandez MRN: [id number] Date of Birth: [birth year]  [facility] CM/SW Contact  Clearance Coots, LCSW Phone Number: 07/10/2020, 10:41 AM  Clinical Narrative:    FL2 completed.  SNF referral sent to [facility] for review.  [employer] initiated. Reference# [id number]  [facility] accepted the patient.  Patient unvaccinated.  Patient will need a covid test prior to discharge.      Barriers to Discharge: Continued Medical Work up, English as a second language teacher  Expected Discharge Plan and Services           Expected Discharge Date: 07/09/20               DME Arranged: 3-N-1 DME Agency: [employer] Date DME Agency Contacted:  (ordered via MD office prior to surgery)     HH Arranged: PT HH Agency: [facility] Date [facility] Agency Contacted:  (ordered via MD office prior to surgery)       Social Determinants of Health (SDOH) Interventions    Readmission Risk Interventions No flowsheet data found.

## 2020-07-10 NOTE — Progress Notes (Signed)
Patient ID: Karen Hernandez, female   DOB: July 21, 1946, 74 y.o.   MRN: [id number] I gave the patient reassurance that the x-ray of her left hip taken this morning looks good.  There was no worrisome findings.  She has now been able to put some weight on her hip going to the bathroom.  She looks better overall.  Her calf is soft.  She would like to try to transition to home in the next day or 2 as opposed to skilled nursing.  She understands that depends on how she is doing with her mobility so encouraged her to try to keep increasing her mobility with therapy.

## 2020-07-10 NOTE — Progress Notes (Signed)
Patient ID: Karen Hernandez, female   DOB: Jun 29, 1946, 74 y.o.   MRN: [id number] The patient has been having a difficult time with mobility secondary to her pain and pain control.  She is very tearful this morning and feels like something is just not right according to her.  Her leg lengths appear equal and she is able to move her foot up and down on the left side.  Her left hip dressing is clean and dry.  At this point, we may need to look into short-term skilled nursing placement.  She has not have a bowel movement so we will try a suppository.  I will order new x-rays of her left hip today and consult the transitional care team about the possibility of short-term skilled nursing.

## 2020-07-10 NOTE — Progress Notes (Signed)
Physical Therapy Treatment Patient Details Name: Karen Hernandez MRN: [id number] DOB: 09/21/1946 Today's Date: 07/10/2020    History of Present Illness Patient is 74 y.o. female s/p Lt THA anterior approach on 07/07/20 with PMH significant for osteoporosis, GERD, OA, Rt THA in 2015.    PT Comments    POD # 3 pm session Pt is progressing slowly with issues of pain control and gait instability.  Assisted with amb to and from bathroom again this afternoon.  Pt requires increased time and much effort.  Excessive WBing thru walker and limited activity tolerance.  Unsteady.  HIGH FALL RISK.  Pt lives home alone and has no one to stay with her 24/7.   Pt will need ST Rehab at SNF prior to returning home.   Follow Up Recommendations  Follow surgeon's recommendation for DC plan and follow-up therapies;SNF     Equipment Recommendations  3in1 (PT)    Recommendations for Other Services       Precautions / Restrictions Precautions Precautions: Fall Restrictions Weight Bearing Restrictions: No Other Position/Activity Restrictions: WBAT    Mobility  Bed Mobility Overal bed mobility: Needs Assistance Bed Mobility: Supine to Sit     Supine to sit: Min guard;Min assist     General bed mobility comments: OOB in recliner  Transfers Overall transfer level: Needs assistance Equipment used: Rolling walker (2 wheeled) Transfers: Sit to/from [employer] Sit to Stand: Min guard Stand pivot transfers: Min guard;Min assist       General transfer comment: 25% VC's on proper hand position and transfer as well as increased time x 2 pt was self able to rise from recliner and toilet.  pt able to self static stand and maintain balance to don/doff underware at [employer] [facility].  Ambulation/Gait Ambulation/Gait assistance: Min guard;Min assist Gait Distance (Feet): 42 Feet Assistive device: Rolling walker (2 wheeled) Gait Pattern/deviations: Step-to pattern;Decreased step length  - left;Decreased step length - right;Decreased stride length Gait velocity: decreased   General Gait Details: 25% VC's on proper sequencing and safety with turns.  Pt was able to amb 20 feet to bathroom and 20 feet back with 7/10 pain "better" than yesterday. slight improvement but still unsteady esp with turns.   Stairs             Wheelchair Mobility    Modified Rankin (Stroke Patients Only)       Balance                                            Cognition Arousal/Alertness: Awake/alert Behavior During Therapy: WFL for tasks assessed/performed Overall Cognitive Status: Within Functional Limits for tasks assessed                                 General Comments: AxO x 3 very pleasant      Exercises      General Comments        Pertinent Vitals/Pain Pain Assessment: 0-10 Pain Score: 7  Pain Location: L hip Pain Descriptors / Indicators: Aching;Burning;Grimacing;Guarding Pain Intervention(s): Monitored during session;Premedicated before session;Repositioned;Ice applied    Home Living                      Prior Function            PT Goals (current  goals can now be found in the care plan section) Progress towards PT goals: Progressing toward goals    Frequency    7X/week      PT Plan Discharge plan needs to be updated    Co-evaluation              AM-PAC PT "6 Clicks" Mobility   Outcome Measure    Help needed moving from lying on your back to sitting on the side of a flat bed without using bedrails?: A Little Help needed moving to and from a bed to a chair (including a wheelchair)?: A Little Help needed standing up from a chair using your arms (e.g., wheelchair or bedside chair)?: A Little Help needed to walk in hospital room?: A Little Help needed climbing 3-5 steps with a railing? : A Lot 6 Click Score: 14    End of Session Equipment Utilized During Treatment: Gait belt Activity  Tolerance: Patient limited by fatigue Patient left: in chair;with call bell/phone within reach Nurse Communication: Mobility status;Patient requests pain meds PT Visit Diagnosis: Muscle weakness (generalized) (M62.81);Difficulty in walking, not elsewhere classified (R26.2);Pain Pain - Right/Left: Left Pain - part of body: Hip     Time: 6629-4765 PT Time Calculation (min) (ACUTE ONLY): 34 min  Charges:  $Gait Training: 8-22 mins $Therapeutic Activity: 8-22 mins                     Felecia Shelling  PTA Acute  Rehabilitation Services Pager      [phone] Office      [phone]

## 2020-07-10 NOTE — Progress Notes (Signed)
Physical Therapy Treatment Patient Details Name: Karen Hernandez MRN: [id number] DOB: Mar 17, 1946 Today's Date: 07/10/2020    History of Present Illness Patient is 74 y.o. female s/p Lt THA anterior approach on 07/07/20 with PMH significant for osteoporosis, GERD, OA, Rt THA in 2015.    PT Comments    POD # 3 am session Assisted OOB to amb to bathroom.  Removed peri wick.  General bed mobility comments: increased time x 3 but self able to transition to EOB.  Still "pain" and "effort".  General transfer comment: 25% VC's on proper hand position and transfer as well as increased time x 2 pt was self able to rise from elevated bed and toilet.  pt able to self static stand and maintain balance to don/doff underware at [facility] Assist. General Gait Details: 25% VC's on proper sequencing and safety with turns.  Pt was able to amb 20 feet to bathroom and 20 feet back with 7/10 pain "better" than yesterday. Positioned in recliner to comfort and performed a few TE's.  Pt tolerated 20 reps AP, 10 reps knee presses, 10 reps LAQ's with a 3 sec hold and 10 reps HS using her belt to self assist.  Applied ICE.   Follow Up Recommendations  Follow surgeons recommendation for DC plan and follow-up therapies;SNF     Equipment Recommendations  3in1 (PT)    Recommendations for Other Services       Precautions / Restrictions Precautions Precautions: Fall Restrictions Weight Bearing Restrictions: No Other Position/Activity Restrictions: WBAT    Mobility  Bed Mobility Overal bed mobility: Needs Assistance Bed Mobility: Supine to Sit     Supine to sit: Min guard;Min assist     General bed mobility comments: increased time x 3 but self able to transition to EOB.  Still "pain" and "effort"  Transfers Overall transfer level: Needs assistance Equipment used: Rolling walker (2 wheeled) Transfers: Sit to/from [employer] Sit to Stand: Min guard Stand pivot transfers: Min guard;Min  assist       General transfer comment: 25% VC's on proper hand position and transfer as well as increased time x 2 pt was self able to rise from elevated bed and toilet.  pt able to self static stand and maintain balance to don/doff underware at [employer].  Ambulation/Gait Ambulation/Gait assistance: Supervision;Min guard Gait Distance (Feet): 40 Feet (120 feet x 2 to and from bathroom) Assistive device: Rolling walker (2 wheeled) Gait Pattern/deviations: Step-to pattern;Decreased step length - left;Decreased step length - right;Decreased stride length Gait velocity: decreased   General Gait Details: 25% VC's on proper sequencing and safety with turns.  Pt was able to amb 20 feet to bathroom and 20 feet back with 7/10 pain "better" than yesterday.   Stairs             Wheelchair Mobility    Modified Rankin (Stroke Patients Only)       Balance                                            Cognition Arousal/Alertness: Awake/alert Behavior During Therapy: WFL for tasks assessed/performed Overall Cognitive Status: Within Functional Limits for tasks assessed                                 General Comments: AxO x 3  very pleasant      Exercises      General Comments        Pertinent Vitals/Pain Pain Assessment: 0-10 Pain Score: 7  Pain Location: L hip Pain Descriptors / Indicators: Aching;Burning;Grimacing;Guarding Pain Intervention(s): Monitored during session;Premedicated before session;Repositioned;Ice applied    Home Living                      Prior Function            PT Goals (current goals can now be found in the care plan section) Progress towards PT goals: Progressing toward goals    Frequency    7X/week      PT Plan Discharge plan needs to be updated    Co-evaluation              AM-PAC PT "6 Clicks" Mobility   Outcome Measure    Help needed moving from lying on your back to  sitting on the side of a flat bed without using bedrails?: A Little Help needed moving to and from a bed to a chair (including a wheelchair)?: A Little Help needed standing up from a chair using your arms (e.g., wheelchair or bedside chair)?: A Little Help needed to walk in hospital room?: A Little Help needed climbing 3-5 steps with a railing? : A Lot 6 Click Score: 14    End of Session Equipment Utilized During Treatment: Gait belt Activity Tolerance: Patient limited by fatigue Patient left: in chair;with call bell/phone within reach Nurse Communication: Mobility status;Patient requests pain meds PT Visit Diagnosis: Muscle weakness (generalized) (M62.81);Difficulty in walking, not elsewhere classified (R26.2);Pain Pain - Right/Left: Left Pain - part of body: Hip     Time: 1937-9024 PT Time Calculation (min) (ACUTE ONLY): 29 min  Charges:  $Gait Training: 8-22 mins $Therapeutic Activity: 8-22 mins                    Felecia Shelling  PTA Acute  Rehabilitation Services Pager      [phone] Office      [phone]

## 2020-07-10 NOTE — Telephone Encounter (Signed)
Nurse Navoreet called from [facility] stated that patient asked for Dr. Magnus Ivan to order preparation H for her hemorrhoids. Please send call in prescription patient is admitted. Nurse Navoreet phone number is [phone].

## 2020-07-11 MED ORDER — OXYCODONE HCL 5 MG PO TABS: 5.0000 mg | ORAL_TABLET | ORAL | 0 refills | Status: DC | PRN

## 2020-07-11 MED ORDER — METHOCARBAMOL 500 MG PO TABS: 500.0000 mg | ORAL_TABLET | Freq: Four times a day (QID) | ORAL | 1 refills | Status: DC | PRN

## 2020-07-11 NOTE — Progress Notes (Signed)
Report given to [facility].

## 2020-07-11 NOTE — Discharge Summary (Signed)
Patient ID: Karen Hernandez MRN: [id number] DOB/AGE: 01/09/1946 74 y.o.  Admit date: 07/07/2020 Discharge date: 07/11/2020  Admission Diagnoses:  Principal Problem:   Unilateral primary osteoarthritis, left hip Active Problems:   Status post total hip replacement, left   Status post total replacement of left hip   Discharge Diagnoses:  Same  Past Medical History:  Diagnosis Date  . Arthritis   . Constipation   . Difficulty sleeping    DUE TO PAIN  . Family history of adverse reaction to anesthesia    "MOTHER HAD MEMORY PROBLEMS AFTER SURG"  . GERD (gastroesophageal reflux disease)   . Nocturia   . Osteoporosis   . Urgency of urination     Surgeries: Procedure(s): LEFT TOTAL HIP ARTHROPLASTY ANTERIOR APPROACH on 07/07/2020   Consultants:   Discharged Condition: Improved  Hospital Course: [name] is an 74 y.o. female who was admitted 07/07/2020 for operative treatment ofUnilateral primary osteoarthritis, left hip. Patient has severe unremitting pain that affects sleep, daily activities, and work/hobbies. After pre-op clearance the patient was taken to the operating room on 07/07/2020 and underwent  Procedure(s): LEFT TOTAL HIP ARTHROPLASTY ANTERIOR APPROACH.    Patient was given perioperative antibiotics:  Anti-infectives (From admission, onward)   Start     Dose/Rate Route Frequency Ordered Stop   07/07/20 1700  ceFAZolin (ANCEF) IVPB 1 g/50 mL premix        1 g 100 mL/hr over 30 Minutes Intravenous  Once 07/07/20 1356     07/07/20 1045  ceFAZolin (ANCEF) IVPB 2g/100 mL premix        2 g 200 mL/hr over 30 Minutes Intravenous On call to O.R. 07/07/20 1035 07/07/20 1218       Patient was given sequential compression devices, early ambulation, and chemoprophylaxis to prevent DVT.  Patient benefited maximally from hospital stay and there were no complications.    Recent vital signs:  Patient Vitals for the past 24 hrs:  BP Temp Temp src Pulse Resp SpO2   07/11/20 0531 124/71 98.3 F (36.8 C) Oral 72 16 97 %  07/10/20 2118 124/60 99.9 F (37.7 C) Oral 88 18 95 %  07/10/20 1324 (!) 123/58 98.8 F (37.1 C) Oral 89 20 97 %  07/10/20 0800 131/66 98.5 F (36.9 C) Oral 76 16 95 %     Recent laboratory studies:  Recent Labs    07/08/20 1103  WBC 11.9*  HGB 9.9*  HCT 30.9*  PLT 200  NA 132*  K 4.0  CL 98  CO2 25  BUN 13  CREATININE 0.61  GLUCOSE 152*  CALCIUM 8.6*     Discharge Medications:   Allergies as of 07/11/2020      Reactions   Other    Dye in the drop to dilated eyes---"Made dizzy for weeks"   Peanuts [peanut Oil] Itching, Swelling      Medication List    STOP taking these medications   acetaminophen-codeine 300-30 MG tablet Commonly known as: TYLENOL #3   HYDROcodone-acetaminophen 5-325 MG tablet Commonly known as: NORCO/VICODIN     TAKE these medications   acetaminophen 650 MG CR tablet Commonly known as: TYLENOL Take 650 mg by mouth every 8 (eight) hours as needed for pain.   Advil Dual Action 125-250 MG Tabs Generic drug: Ibuprofen-Acetaminophen Take 2 tablets by mouth every 4 (four) hours as needed (pain).   aspirin 81 MG chewable tablet Commonly known as: Aspirin Childrens Chew 1 tablet (81 mg total) by mouth 2 (two) times  daily after a meal.   b complex vitamins tablet Take 1 tablet by mouth daily.   diclofenac 75 MG EC tablet Commonly known as: VOLTAREN Take 1 tablet (75 mg total) by mouth 2 (two) times daily between meals as needed.   diclofenac Sodium 1 % Gel Commonly known as: VOLTAREN Apply 1 application topically 4 (four) times daily as needed (pain).   diphenhydramine-acetaminophen 25-500 MG Tabs tablet Commonly known as: TYLENOL PM Take 1 tablet by mouth at bedtime as needed (sleep).   fexofenadine 180 MG tablet Commonly known as: ALLEGRA Take 180 mg by mouth daily as needed for allergies or rhinitis.   FIBER PO Take 3-4 each by mouth at bedtime. Chewable.   LUBRICATING  EYE DROPS OP Place 1 drop into both eyes at bedtime.   methocarbamol 500 MG tablet Commonly known as: ROBAXIN Take 1 tablet (500 mg total) by mouth every 6 (six) hours as needed for muscle spasms.   ondansetron 4 MG disintegrating tablet Commonly known as: Zofran ODT Take 1 tablet (4 mg total) by mouth every 8 (eight) hours as needed for nausea or vomiting.   oxyCODONE 5 MG immediate release tablet Commonly known as: Oxy IR/ROXICODONE Take 1-2 tablets (5-10 mg total) by mouth every 4 (four) hours as needed for moderate pain (pain score 4-6).   oxymetazoline 0.05 % nasal spray Commonly known as: AFRIN Place 1 spray into both nostrils at bedtime as needed for congestion.   PreserVision AREDS 2 Caps Take 1 capsule by mouth daily.   SALONPAS PAIN RELIEF PATCH EX Apply 1 patch topically daily as needed (pain).   Vitamin C Chew Chew 2 tablets by mouth daily.   Vitamin D 50 MCG (2000 UT) Caps Take 2,000 Units by mouth daily.            Durable Medical Equipment  (From admission, onward)         Start     Ordered   07/07/20 2019  DME 3 n 1  Once        07/07/20 2018   07/07/20 2019  DME Walker rolling  Once       Question Answer Comment  Walker: With 5 Inch Wheels   Patient needs a walker to treat with the following condition Status post total replacement of left hip      07/07/20 2018          Diagnostic Studies: DG Pelvis Portable  Result Date: 07/07/2020 CLINICAL DATA:  74 year old female status post left hip arthroplasty today. EXAM: PORTABLE PELVIS 1-2 VIEWS COMPARISON:  Intraoperative images 12 45 hours today. Pelvis 03/08/2020. FINDINGS: Portable AP view of the lower pelvis at 1415 hours. Pre-existing bipolar right hip arthroplasty. New bipolar left hip arthroplasty. Hardware appears intact with normal AP alignment. No unexpected osseous changes. Partially visible left side skin staples. Visible pelvis appears intact. Negative visible bowel gas pattern.  IMPRESSION: New left bipolar hip arthroplasty with no adverse features. Electronically Signed   By: Odessa Fleming M.D.   On: 07/07/2020 14:24   DG C-Arm 1-60 Min-No Report  Result Date: 07/07/2020 CLINICAL DATA:  Left hip arthroplasty EXAM: OPERATIVE LEFT HIP (WITH PELVIS IF PERFORMED) AP VIEWS TECHNIQUE: Fluoroscopic spot image(s) were submitted for interpretation post-operatively. COMPARISON:  03/08/2020 FINDINGS: 3 C-arm fluoroscopic images were obtained intraoperatively and submitted for post operative interpretation. Interval placement of left total hip arthroplasty hardware without evidence of malalignment or complication. 17 seconds of fluoroscopy time was utilized. Please see the performing provider's  procedural report for further detail. IMPRESSION: As above. Electronically Signed   By: Duanne Guess D.O.   On: 07/07/2020 13:53   DG HIP PORT UNILAT WITH PELVIS 1V LEFT  Result Date: 07/10/2020 CLINICAL DATA:  Worsening left hip pain following hip replacement, initial encounter EXAM: DG HIP (WITH OR WITHOUT PELVIS) 1V PORT LEFT COMPARISON:  07/07/2020 FINDINGS: Bilateral hip replacements are noted. No acute fracture or dislocation is noted. No soft tissue abnormality is noted. IMPRESSION: Status post left hip replacement.  No acute abnormality noted. Electronically Signed   By: Alcide Clever M.D.   On: 07/10/2020 08:10   DG HIP OPERATIVE UNILAT W OR W/O PELVIS LEFT  Result Date: 07/07/2020 CLINICAL DATA:  Left hip arthroplasty EXAM: OPERATIVE LEFT HIP (WITH PELVIS IF PERFORMED) AP VIEWS TECHNIQUE: Fluoroscopic spot image(s) were submitted for interpretation post-operatively. COMPARISON:  03/08/2020 FINDINGS: 3 C-arm fluoroscopic images were obtained intraoperatively and submitted for post operative interpretation. Interval placement of left total hip arthroplasty hardware without evidence of malalignment or complication. 17 seconds of fluoroscopy time was utilized. Please see the performing provider's  procedural report for further detail. IMPRESSION: As above. Electronically Signed   By: Duanne Guess D.O.   On: 07/07/2020 13:53    Disposition: Discharge disposition: 03-Skilled Nursing Facility       Discharge Instructions    Discharge patient   Complete by: As directed    Discharge disposition: 01-Home or Self Care   Discharge patient date: 07/07/2020       Contact information for follow-up providers    Kathryne Hitch, MD In 2 weeks.   Specialty: Orthopedic Surgery Contact information: [address] [place] [address] [phone]        Care, [facility] Follow up.   Why: to provide home health physical therapy Contact information: [address] [place] [address] [phone]            Contact information for after-discharge care    Destination    [facility] .   Service: Skilled Nursing Contact information: [address] [place] [address] [phone]                   Signed: Kathryne Hitch 07/11/2020, 7:25 AM

## 2020-07-11 NOTE — Progress Notes (Signed)
Patient ID: Karen Hernandez, female   DOB: 1945-12-27, 74 y.o.   MRN: [id number] The patient is awake and alert this morning.  Her vital signs are stable.  Her left hip is stable.  Medically she is doing well.  She is still having a problem with pain control and gait and balance in general.  X-rays of her left hip yesterday showed a well-seated implant.  At this point we will plan on discharging her to short-term skilled nursing to continue her rehabilitation.  She understands this as well.  We can discharge her today.

## 2020-07-11 NOTE — TOC Transition Note (Addendum)
Transition of Care [facility]) - CM/SW Discharge Note   Patient Details  Name: Karen Hernandez MRN: [id number] Date of Birth: [birth year]  Transition of Care [facility]) CM/SW Contact:  Clearance Coots, LCSW Phone Number: 07/11/2020, 8:37 AM   Clinical Narrative:    [facility] -Medicare approval  Auth. [id number] Start Day Sept.28th-30th  Details provided to [facility] test negative.  PTAR to transport 11:30am  Nurse call report to; [phone] Room 203     Final next level of care: Skilled Nursing Facility Barriers to Discharge: Barriers Resolved   Patient Goals and CMS Choice Patient states their goals for this hospitalization and ongoing recovery are:: return home with pain more under control CMS Medicare.gov Compare Post Acute Care list provided to:: Patient Choice offered to / list presented to : Patient  Discharge Placement PASRR number recieved: 07/11/20            Patient chooses bed at:  [facility]) Patient to be transferred to facility by: PTAR Name of family member notified: Patient to notify her friend. Patient and family notified of of transfer: 07/11/20  Discharge Plan and Services                DME Arranged: 3-N-1 DME Agency: [employer] Date DME Agency Contacted:  (ordered via MD office prior to surgery)     HH Arranged: PT HH Agency: [facility] Date [facility] Agency Contacted:  (ordered via MD office prior to surgery)      Social Determinants of Health (SDOH) Interventions     Readmission Risk Interventions No flowsheet data found.

## 2020-07-11 NOTE — Care Management Important Message (Signed)
Important Message  Patient Details IM Letter given to the Patient Name: Karen Hernandez MRN: [id number] Date of Birth: 27-Dec-1945   Medicare Important Message Given:  Yes     [name] 07/11/2020, 11:15 AM

## 2020-07-11 NOTE — Progress Notes (Signed)
Physical Therapy Treatment Patient Details Name: Karen Hernandez MRN: [id number] DOB: 03/06/1946 Today's Date: 07/11/2020    History of Present Illness Patient is 74 y.o. female s/p Lt THA anterior approach on 07/07/20 with PMH significant for osteoporosis, GERD, OA, Rt THA in 2015.    PT Comments    Pt up in recliner and agreeable to ambulate. Pt reports feeling better today.  Pt able to improve distance today and anticipates d/c to SNF.   Follow Up Recommendations  Follow surgeon's recommendation for DC plan and follow-up therapies;SNF     Equipment Recommendations  3in1 (PT)    Recommendations for Other Services       Precautions / Restrictions Precautions Precautions: Fall Restrictions Other Position/Activity Restrictions: WBAT    Mobility  Bed Mobility               General bed mobility comments: OOB in recliner  Transfers Overall transfer level: Needs assistance Equipment used: Rolling walker (2 wheeled) Transfers: Sit to/from Stand Sit to Stand: Min guard         General transfer comment: verbal cues for hand placement  Ambulation/Gait Ambulation/Gait assistance: Min guard Gait Distance (Feet): 80 Feet Assistive device: Rolling walker (2 wheeled) Gait Pattern/deviations: Step-to pattern;Decreased stride length;Decreased stance time - left;Antalgic Gait velocity: decreased   General Gait Details: verbal cues for sequence, RW positioning, posture   Stairs             Wheelchair Mobility    Modified Rankin (Stroke Patients Only)       Balance                                            Cognition Arousal/Alertness: Awake/alert Behavior During Therapy: WFL for tasks assessed/performed Overall Cognitive Status: Within Functional Limits for tasks assessed                                 General Comments: AxO x 3 very pleasant      Exercises      General Comments        Pertinent Vitals/Pain  Pain Assessment: 0-10 Pain Score: 7  Pain Location: L hip Pain Descriptors / Indicators: Aching;Burning;Grimacing;Guarding Pain Intervention(s): Repositioned;Monitored during session;Premedicated before session    Home Living                      Prior Function            PT Goals (current goals can now be found in the care plan section) Progress towards PT goals: Progressing toward goals    Frequency    7X/week      PT Plan Current plan remains appropriate    Co-evaluation              AM-PAC PT "6 Clicks" Mobility   Outcome Measure  Help needed turning from your back to your side while in a flat bed without using bedrails?: A Little Help needed moving from lying on your back to sitting on the side of a flat bed without using bedrails?: A Little Help needed moving to and from a bed to a chair (including a wheelchair)?: A Little Help needed standing up from a chair using your arms (e.g., wheelchair or bedside chair)?: A Little Help needed to walk in hospital room?: A  Little Help needed climbing 3-5 steps with a railing? : A Lot 6 Click Score: 17    End of Session Equipment Utilized During Treatment: Gait belt Activity Tolerance: Patient tolerated treatment well Patient left: in chair;with call bell/phone within reach   PT Visit Diagnosis: Muscle weakness (generalized) (M62.81);Difficulty in walking, not elsewhere classified (R26.2)     Time: 1012-1027 PT Time Calculation (min) (ACUTE ONLY): 15 min  Charges:  $Gait Training: 8-22 mins                     Paulino Door, DPT Acute Rehabilitation Services Pager: [phone] Office: [phone]  Maida Sale E 07/11/2020, 3:11 PM

## 2020-07-18 ENCOUNTER — Telehealth: Payer: Self-pay | Admitting: Orthopaedic Surgery

## 2020-07-18 NOTE — Telephone Encounter (Signed)
Verbal order given  

## 2020-07-18 NOTE — Telephone Encounter (Signed)
Received call from [facility] with [facility] needing verbal orders fir HHPT 2 Wk 3 and 1 Wk 6. The number to contact Earley Abide is [phone]

## 2020-07-20 ENCOUNTER — Ambulatory Visit (INDEPENDENT_AMBULATORY_CARE_PROVIDER_SITE_OTHER): Payer: Medicare Other | Admitting: Physician Assistant

## 2020-07-20 ENCOUNTER — Encounter: Payer: Self-pay | Admitting: Physician Assistant

## 2020-07-20 DIAGNOSIS — Z96642 Presence of left artificial hip joint: Secondary | ICD-10-CM

## 2020-07-20 DIAGNOSIS — J309 Allergic rhinitis, unspecified: Secondary | ICD-10-CM | POA: Insufficient documentation

## 2020-07-20 DIAGNOSIS — M2170 Unequal limb length (acquired), unspecified site: Secondary | ICD-10-CM

## 2020-07-20 MED ORDER — METHOCARBAMOL 500 MG PO TABS: 500.0000 mg | ORAL_TABLET | Freq: Four times a day (QID) | ORAL | 1 refills | Status: DC | PRN

## 2020-07-20 MED ORDER — HYDROCODONE-ACETAMINOPHEN 5-325 MG PO TABS: 1.0000 | ORAL_TABLET | Freq: Four times a day (QID) | ORAL | 0 refills | Status: AC | PRN

## 2020-07-20 MED ORDER — GABAPENTIN 100 MG PO CAPS: 300.0000 mg | ORAL_CAPSULE | Freq: Every day | ORAL | 1 refills | Status: DC

## 2020-07-20 NOTE — Progress Notes (Signed)
HPI: [name] returns today 2 weeks status post left total hip arthroplasty.  She states she has had no drainage no fevers chills.  She has had some burning stinging about the left hip.  She also notes that she has a leg length discrepancy and this is bothering her.  She reports that her primary care physician is checking her for UTIs she has had increased nocturia since surgery.  She denies any dysuria.  Physical exam: Left hip surgical incisions well approximated staples no signs of infection.  No seroma.  Left calf is supple nontender.  Dorsiflexion plantarflexion left ankle intact.  She does have this a leg length discrepancy with the right leg being shorter by approximately quarter inch in the left leg.  Impression: S/P Left total hip arthroplasty Leg length discrepancy  Plan: We will place her on Neurontin due to the neuropathic pain she is having in the left hip.  This should dissipate with time.  Staples removed Steri-Strips applied.  She can go back on her regular aspirin she was taking prior to surgery.  She will follow-up with her primary care physician due to your increased nocturia.  Refill on her hydrocodone and Robaxin were sent in.  She will follow-up with [place] in 1 month sooner if there is any questions concerns in regards to her leg length discrepancy she can place a over-the-counter insert in her right shoe and see if this helps her compensate.  She may benefit from a lift from prosthesis in the future.

## 2020-08-17 ENCOUNTER — Ambulatory Visit: Payer: Medicare Other | Admitting: Physician Assistant

## 2020-08-23 ENCOUNTER — Ambulatory Visit (INDEPENDENT_AMBULATORY_CARE_PROVIDER_SITE_OTHER): Payer: Medicare Other | Admitting: Physician Assistant

## 2020-08-23 ENCOUNTER — Encounter: Payer: Self-pay | Admitting: Physician Assistant

## 2020-08-23 DIAGNOSIS — M25511 Pain in right shoulder: Secondary | ICD-10-CM | POA: Diagnosis not present

## 2020-08-23 DIAGNOSIS — Z96642 Presence of left artificial hip joint: Secondary | ICD-10-CM

## 2020-08-23 DIAGNOSIS — M2170 Unequal limb length (acquired), unspecified site: Secondary | ICD-10-CM

## 2020-08-23 MED ORDER — METHYLPREDNISOLONE ACETATE 40 MG/ML IJ SUSP
40.0000 mg | INTRAMUSCULAR | Status: AC | PRN
  Administered 2020-08-23: 40 mg via INTRA_ARTICULAR

## 2020-08-23 MED ORDER — METHOCARBAMOL 500 MG PO TABS: 500.0000 mg | ORAL_TABLET | Freq: Four times a day (QID) | ORAL | 1 refills | Status: DC | PRN

## 2020-08-23 MED ORDER — LIDOCAINE HCL 1 % IJ SOLN
3.0000 mL | INTRAMUSCULAR | Status: AC | PRN
  Administered 2020-08-23: 3 mL

## 2020-08-23 NOTE — Progress Notes (Addendum)
.       HPI: [name] returns today 6 weeks status post left total hip arthroplasty.  She states she is still have some soreness.  She notes that the leg length discrepancy definitely is bothersome to her.  She is having some low back pain she feels is due to her leg lengths being off.  She does have a small insert in her right shoe today.  States she did not get inserts for regular shoes as of yet.  She was not aware that I was recommending a off-the-shelf insert for her shoe.  States that her surgical incision is healing well. She does feel the Neurontin has helped with stinging pain she was having about the left hip.  She is also taking Robaxin and is asking for refill on this. Lasting for right shoulder injection.  She last had injection 03/08/2020.  Physical exam: Left hip excellent range of motion without significant pain.  Leg length discrepancy with the right leg being slightly shorter than the left.  The left that she has in her shoe when standing her pelvis feels level on exam.  Impression: Status post left total hip arthroplasty 07/07/2020 Right shoulder pain  Plan: At her request we provided a right shoulder subacromial injection today.  We will refill her Robaxin.  She will continue with Neurontin at night.  Discussed with her at length that she can obtain a small insert for her right shoe that she can purchase off the shelf.  Did offer to have her formally go and have a custom insert for her shoes. She defers.  We will see her back in 3 months no radiographs at that time.  Questions encouraged and answered.     Procedure Note  Patient: Karen Hernandez             Date of Birth: 05-20-1946           MRN: [id number]             Visit Date: 08/23/2020  Procedures: Visit Diagnoses:  1. Right shoulder pain, unspecified chronicity     Large Joint Inj: R subacromial bursa on 08/23/2020 2:13 PM Indications: pain Details: 22 G 1.5 in needle, superior approach  Arthrogram:  No  Medications: 3 mL lidocaine 1 %; 40 mg methylPREDNISolone acetate 40 MG/ML Outcome: tolerated well, no immediate complications Procedure, treatment alternatives, risks and benefits explained, specific risks discussed. Consent was given by the patient. Immediately prior to procedure a time out was called to verify the correct patient, procedure, equipment, support staff and site/side marked as required. Patient was prepped and draped in the usual sterile fashion.

## 2020-08-23 NOTE — Addendum Note (Signed)
Addended by: Richardean Canal on: 08/23/2020 03:21 PM   Modules accepted: Orders

## 2020-08-30 ENCOUNTER — Telehealth: Payer: Self-pay

## 2020-08-30 NOTE — Telephone Encounter (Signed)
That's fine

## 2020-08-30 NOTE — Telephone Encounter (Signed)
Patient called in wanting to get form filled out so patient can get fitted for her leg. Said she is getting sore from trying to get her balance

## 2020-09-06 ENCOUNTER — Telehealth: Payer: Self-pay | Admitting: Orthopaedic Surgery

## 2020-09-06 NOTE — Telephone Encounter (Signed)
How should I write this?

## 2020-09-06 NOTE — Telephone Encounter (Signed)
Patient aware I will put this in the mail for her

## 2020-09-06 NOTE — Telephone Encounter (Signed)
Patient called asked if she can get a Rx for a heel lift for her right shoe. Patient asked if the Rx can be mailed to her. The number to contact patient [phone] or [phone]

## 2020-09-06 NOTE — Telephone Encounter (Signed)
ON YOUR DESK

## 2020-09-22 ENCOUNTER — Telehealth: Payer: Self-pay | Admitting: Orthopaedic Surgery

## 2020-09-22 NOTE — Telephone Encounter (Signed)
Patient aware.

## 2020-09-22 NOTE — Telephone Encounter (Signed)
Pt called stating she had some pain Monday night while she was in the shower and her PT advised her to make Dr.Blackman aware; pt would like a CB to discuss further  [phone]

## 2020-09-22 NOTE — Telephone Encounter (Signed)
Just let her know that is she keeps having pain, to schedule to come see [place] in the office.  Should just take OTC meds for now if needed.

## 2020-09-28 ENCOUNTER — Ambulatory Visit: Payer: Self-pay

## 2020-09-28 ENCOUNTER — Encounter: Payer: Self-pay | Admitting: Physician Assistant

## 2020-09-28 ENCOUNTER — Ambulatory Visit (INDEPENDENT_AMBULATORY_CARE_PROVIDER_SITE_OTHER): Payer: Medicare Other | Admitting: Physician Assistant

## 2020-09-28 DIAGNOSIS — M7062 Trochanteric bursitis, left hip: Secondary | ICD-10-CM

## 2020-09-28 MED ORDER — METHYLPREDNISOLONE ACETATE 40 MG/ML IJ SUSP
40.0000 mg | INTRAMUSCULAR | Status: AC | PRN
  Administered 2020-09-28: 16:00:00 40 mg via INTRA_ARTICULAR

## 2020-09-28 MED ORDER — LIDOCAINE HCL 1 % IJ SOLN
3.0000 mL | INTRAMUSCULAR | Status: AC | PRN
  Administered 2020-09-28: 16:00:00 3 mL

## 2020-09-28 NOTE — Progress Notes (Signed)
   Procedure Note  Patient: Karen Hernandez             Date of Birth: 1946-09-22           MRN: [id number]             Visit Date: 09/28/2020 HPI: [name] returns today for left hip pain.  She states the pain returned after twisting the shower couple weeks ago.  She had no fall.  She denies any numbness tingling down the left leg.  She is still dealing with the leg length discrepancy and has began working on getting an insert for her shoe.  Pain is mostly lateral aspect left hip but does radiate down to the knee at times.  Physical exam: Left hip excellent range of motion without pain.  Tenderness over the left trochanteric region.  Ambulates without any assistive device in a nonantalgic gait.  Procedures: Visit Diagnoses:  1. Trochanteric bursitis of left hip     Large Joint Inj on 09/28/2020 3:33 PM Indications: pain Details: 22 G 1.5 in needle, lateral approach  Arthrogram: No  Medications: 3 mL lidocaine 1 %; 40 mg methylPREDNISolone acetate 40 MG/ML Outcome: tolerated well, no immediate complications Procedure, treatment alternatives, risks and benefits explained, specific risks discussed. Consent was given by the patient. Immediately prior to procedure a time out was called to verify the correct patient, procedure, equipment, support staff and site/side marked as required. Patient was prepped and draped in the usual sterile fashion.     Plan: She shown IT band stretching exercises.  We will see her back at her regular follow-up in February as scheduled.  Questions were encouraged and answered.  Encouraged her to obtain the insert for her right shoe.

## 2020-11-23 ENCOUNTER — Encounter: Payer: Self-pay | Admitting: Physician Assistant

## 2020-11-23 ENCOUNTER — Ambulatory Visit (INDEPENDENT_AMBULATORY_CARE_PROVIDER_SITE_OTHER): Payer: Medicare Other | Admitting: Physician Assistant

## 2020-11-23 DIAGNOSIS — M2170 Unequal limb length (acquired), unspecified site: Secondary | ICD-10-CM | POA: Diagnosis not present

## 2020-11-23 DIAGNOSIS — M7062 Trochanteric bursitis, left hip: Secondary | ICD-10-CM

## 2020-11-23 DIAGNOSIS — Z96642 Presence of left artificial hip joint: Secondary | ICD-10-CM | POA: Diagnosis not present

## 2020-11-23 NOTE — Progress Notes (Signed)
HPI: [name] returns today follow-up of her left hip.  We did a trochanteric injection her left hip on 09/28/2020.  She states that helped with her pain quite a bit.  She still has some pain occasionally left hip.  But overall is doing well.  She is doing IT band stretching.  She is status post left total hip arthroplasty July 07, 2020.  Did obtain a lift of her right shoe due to her leg length discrepancy.  Review of systems: Please see HPI otherwise negative or noncontributory.  Physical exam: Left hip excellent range of motion without pain.  Right hip excellent range of motion without pain.  Ambulates without any assistive device.  Slight tenderness over the right trochanteric region with palpation.  Impression: Status post left total hip arthroplasty July 07, 2020 Left hip trochanteric bursitis  Plan: She will continue to work on IT band stretching.  We will see her back at her 1 year postop visit in September.  At that time we will obtain an AP pelvis and lateral view left hip. Questions encouraged and answered by Dr. Magnus Hernandez myself.

## 2020-12-27 ENCOUNTER — Telehealth: Payer: Self-pay | Admitting: Orthopaedic Surgery

## 2020-12-27 ENCOUNTER — Other Ambulatory Visit: Payer: Self-pay

## 2020-12-27 MED ORDER — GABAPENTIN 100 MG PO CAPS: 300.0000 mg | ORAL_CAPSULE | Freq: Every day | ORAL | 1 refills | Status: AC

## 2020-12-27 NOTE — Telephone Encounter (Signed)
yes

## 2020-12-27 NOTE — Telephone Encounter (Signed)
Ok to refill 

## 2020-12-27 NOTE — Telephone Encounter (Signed)
Sent in to pharmacy.  

## 2020-12-27 NOTE — Telephone Encounter (Signed)
Pt called and is requesting a refill on gabapentin

## 2021-01-04 IMAGING — RF DG HIP (WITH PELVIS) OPERATIVE*L*
1 series · 3 of 3 positions shown · non-contrast
Comparison: 03/08/2020

CLINICAL DATA: Left hip arthroplasty

EXAM:
OPERATIVE LEFT HIP (WITH PELVIS IF PERFORMED) AP VIEWS
TECHNIQUE: Fluoroscopic spot image(s) were submitted for interpretation
post-operatively.

[Series 1: unknown protocol · 0.20mm/px · 3 of 3 slices shown]
[im 1/3]
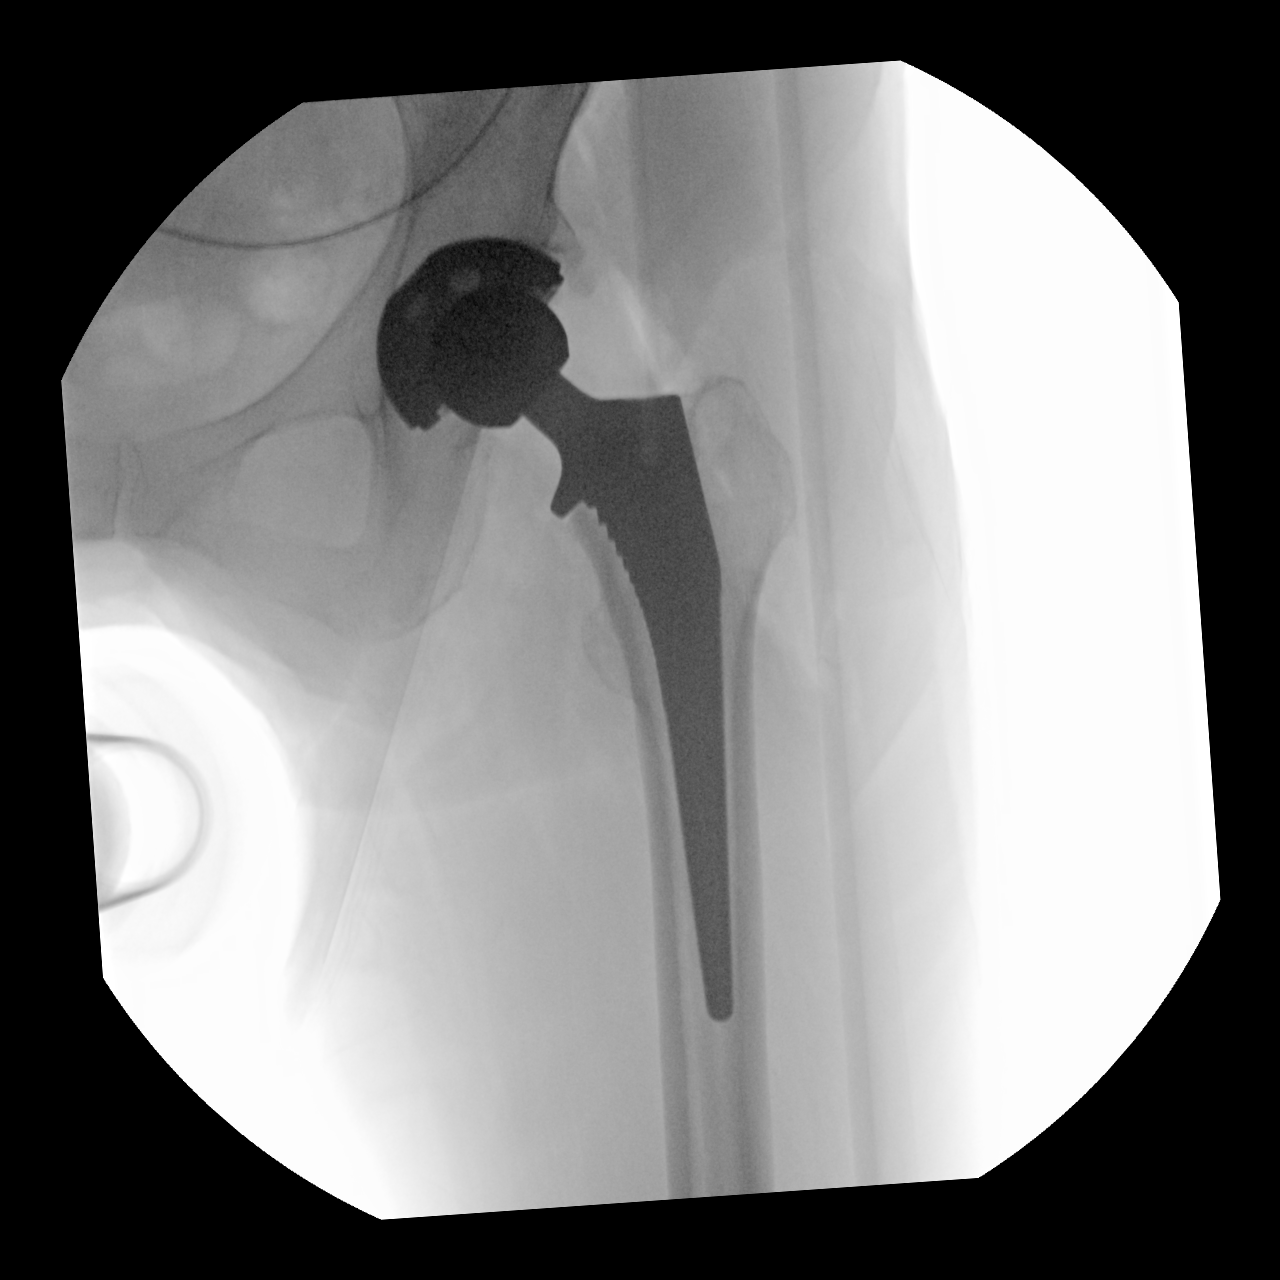
[im 2/3]
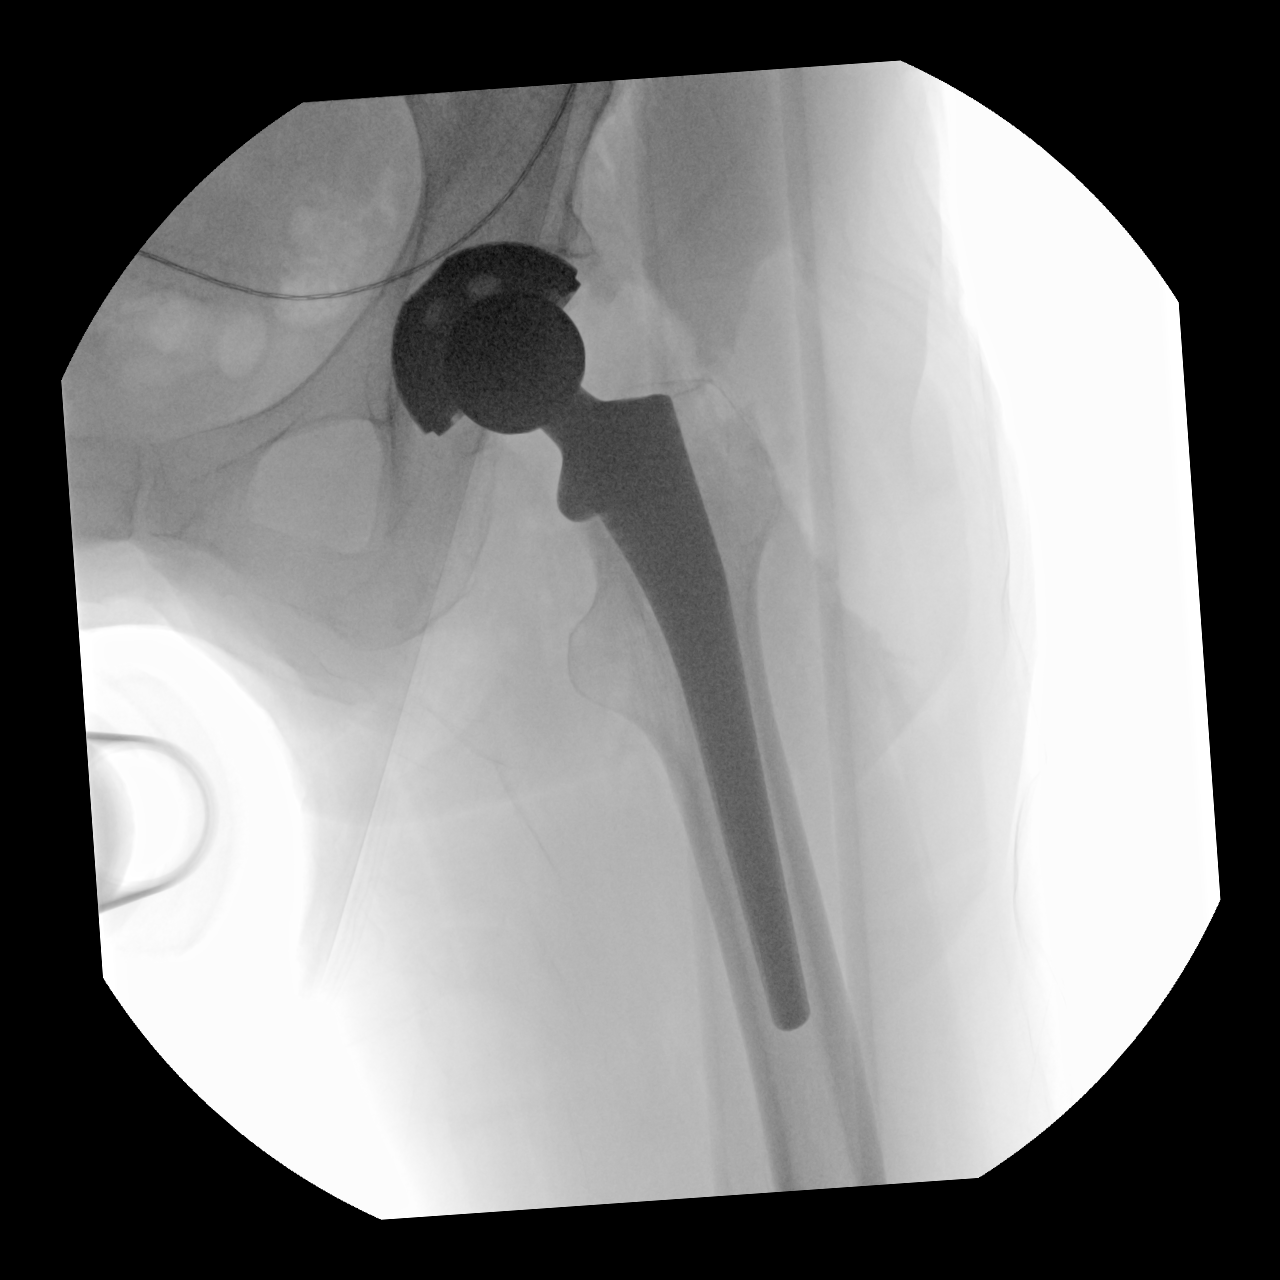
[im 3/3]
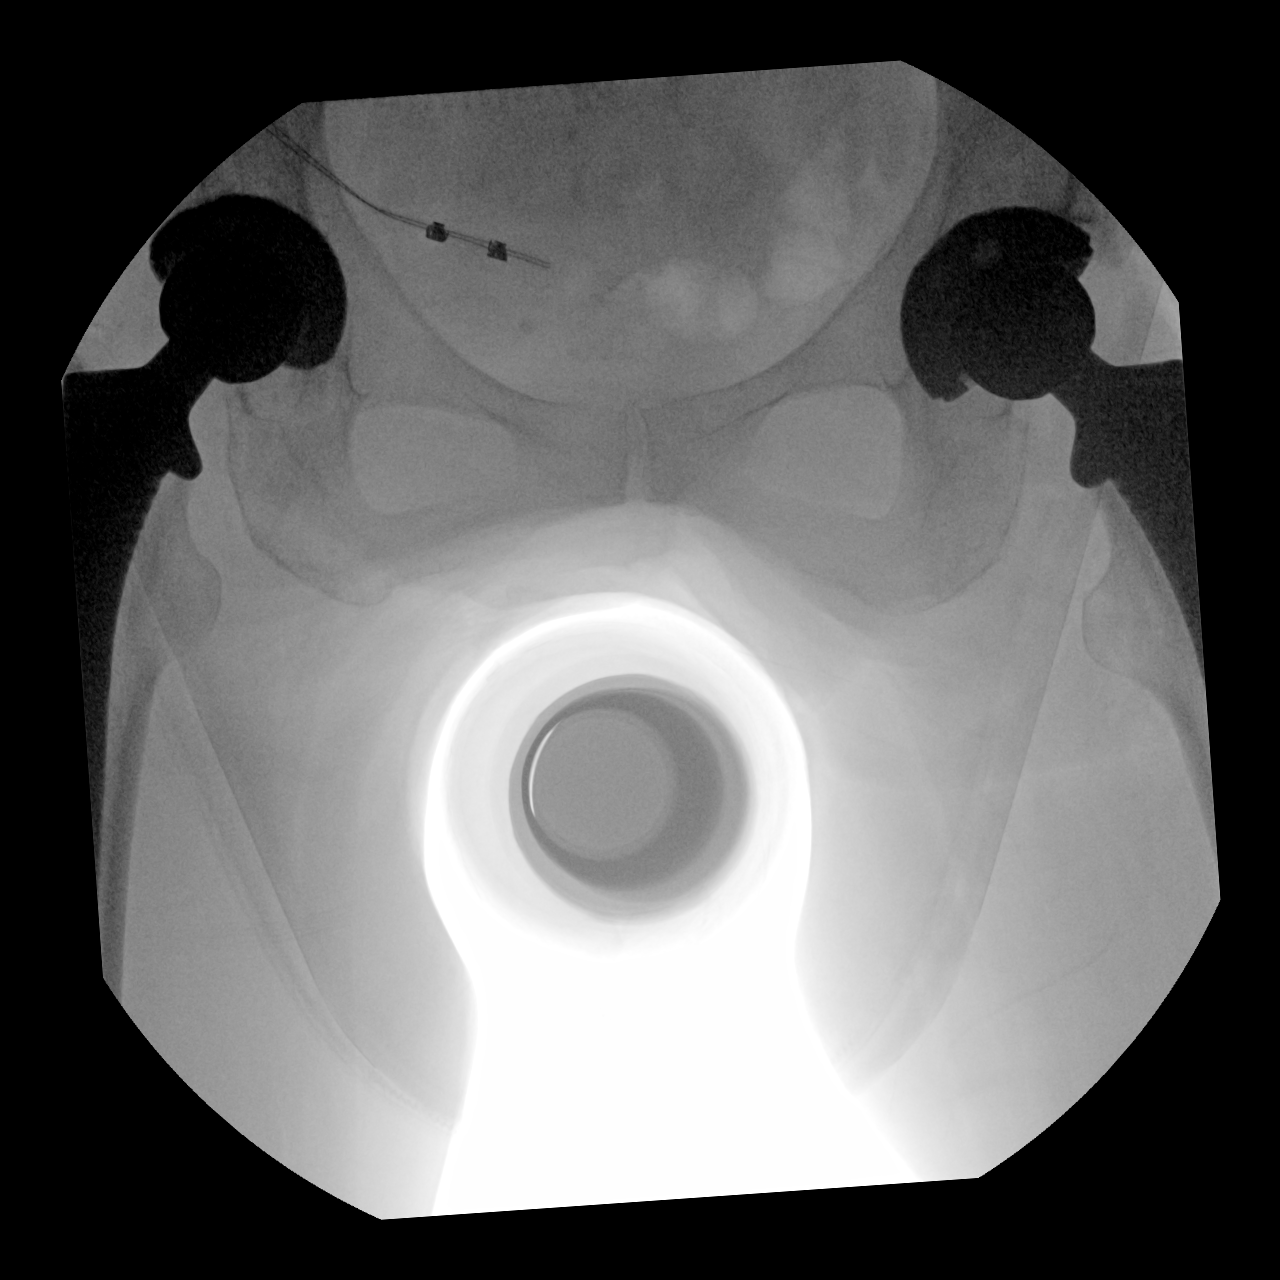

[3 of 3 positions shown; findings below may reference images not displayed]

FINDINGS: 3 C-arm fluoroscopic images were obtained intraoperatively and
submitted for post operative interpretation. Interval placement of
left total hip arthroplasty hardware without evidence of
malalignment or complication. 17 seconds of fluoroscopy time was
utilized. Please see the performing provider's procedural report for
further detail.
IMPRESSION: As above.

## 2021-01-04 IMAGING — RF DG C-ARM 1-60 MIN-NO REPORT
1 series · 3 of 3 positions shown · non-contrast
Comparison: 03/08/2020

CLINICAL DATA: Left hip arthroplasty

EXAM:
OPERATIVE LEFT HIP (WITH PELVIS IF PERFORMED) AP VIEWS
TECHNIQUE: Fluoroscopic spot image(s) were submitted for interpretation
post-operatively.

[Series 1: unknown protocol · 0.20mm/px · 3 of 3 slices shown]
[im 1/3]
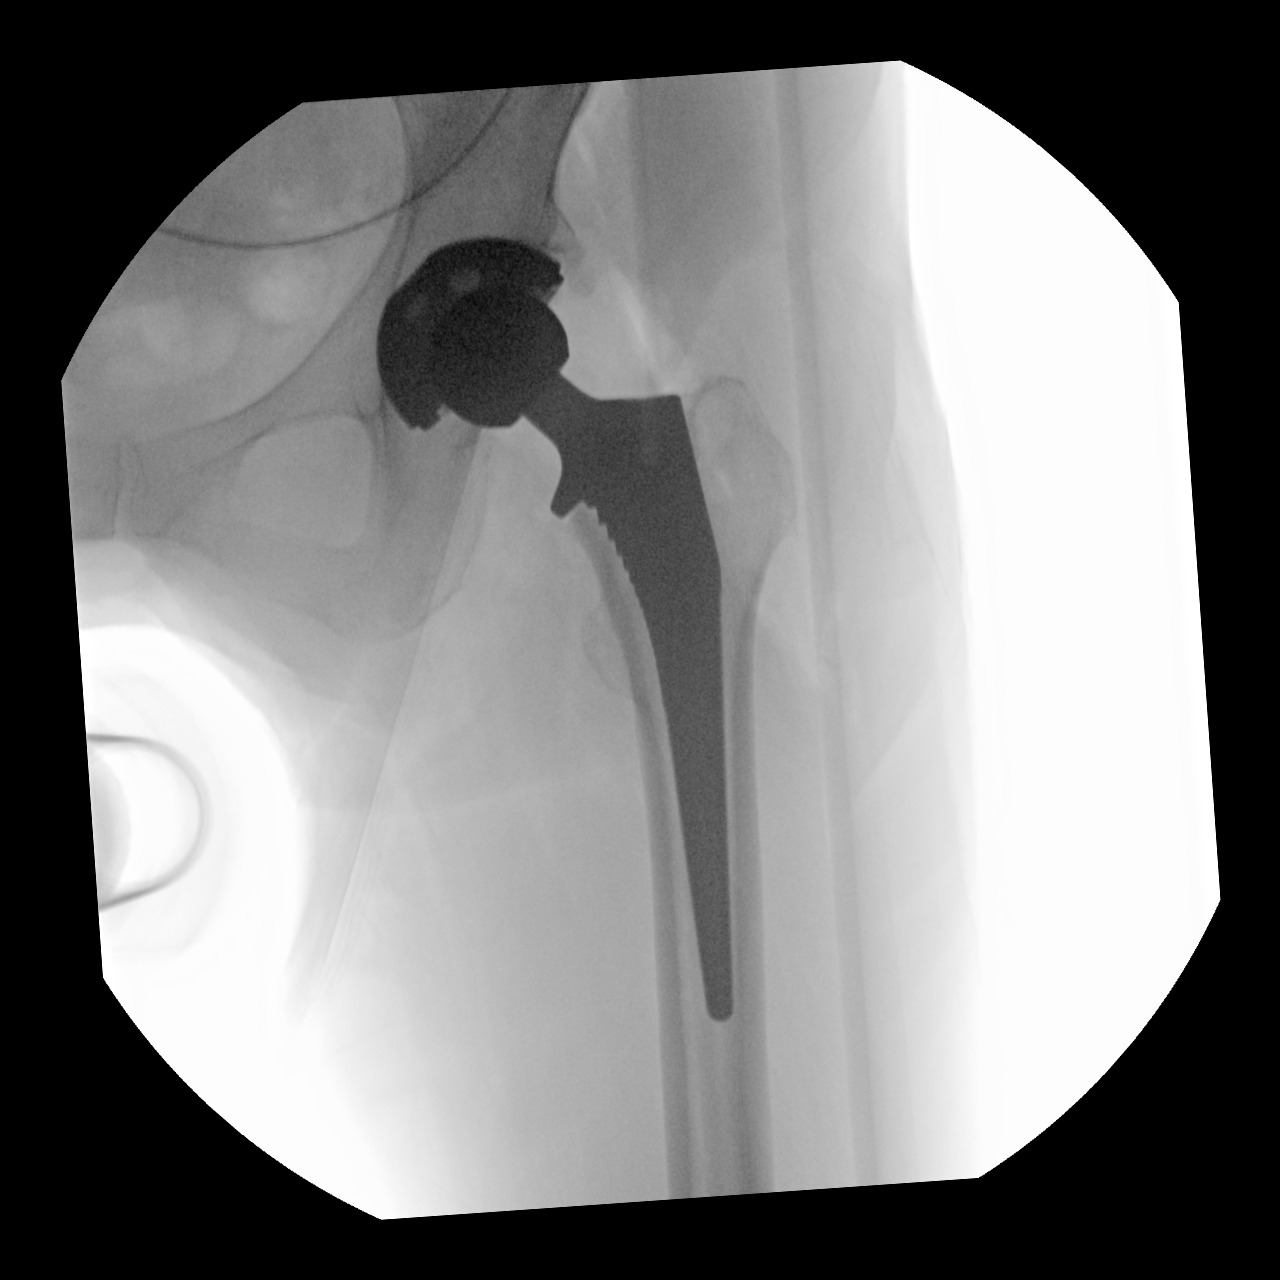
[im 2/3]
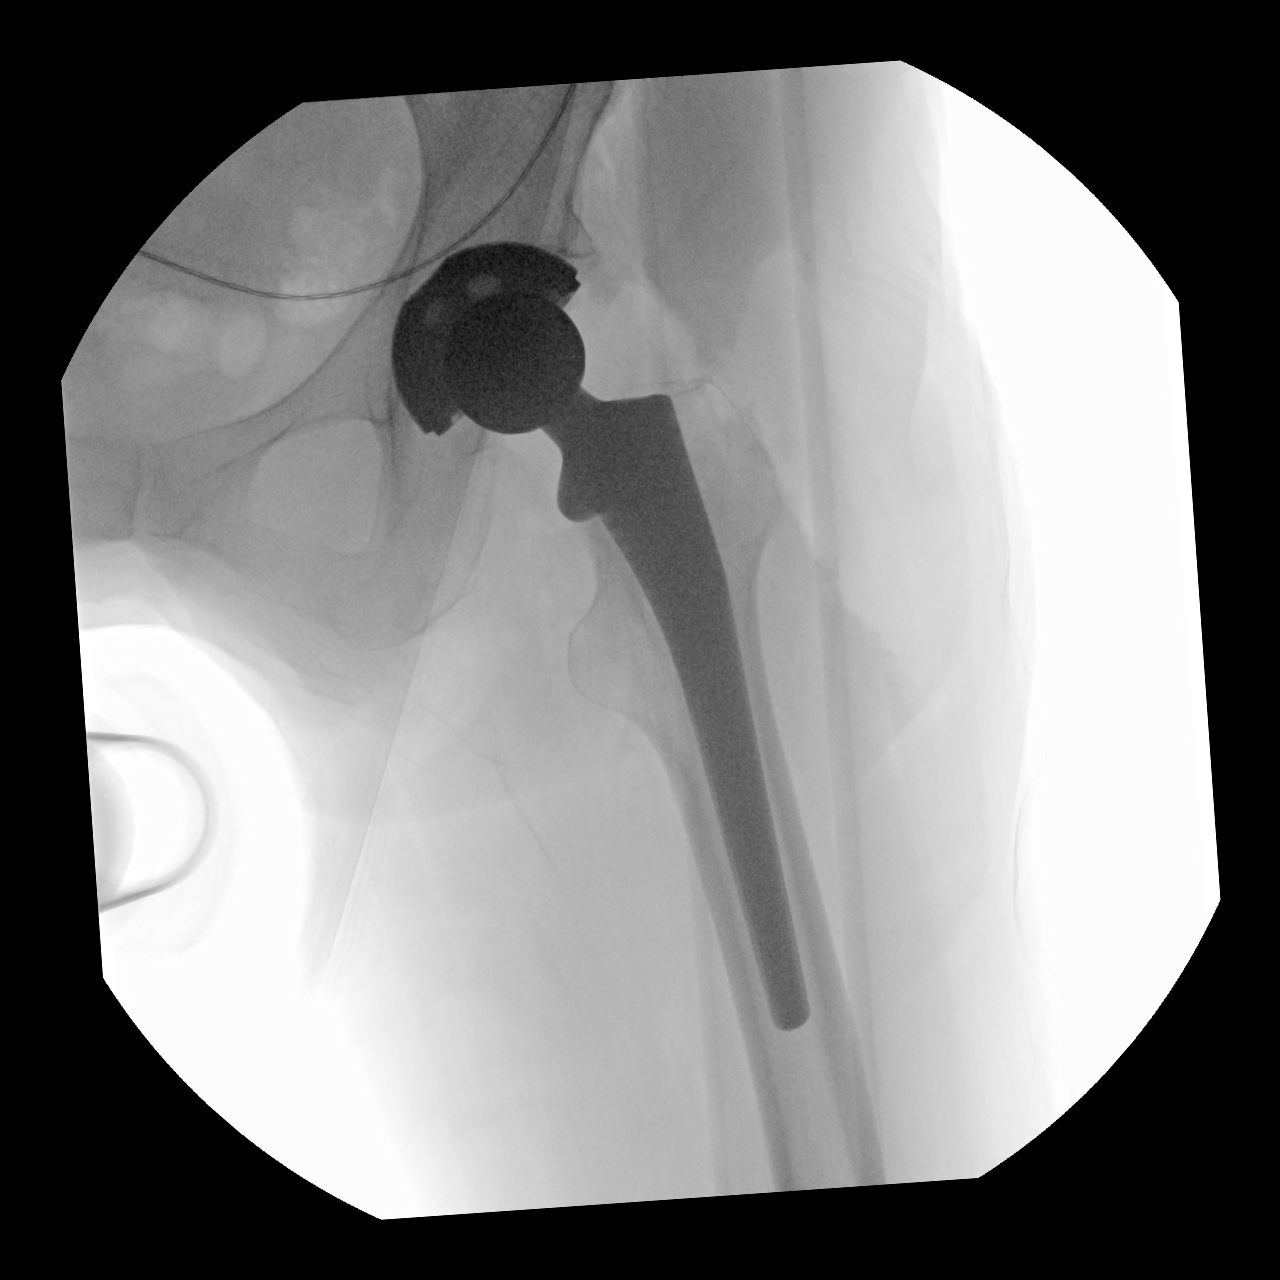
[im 3/3]
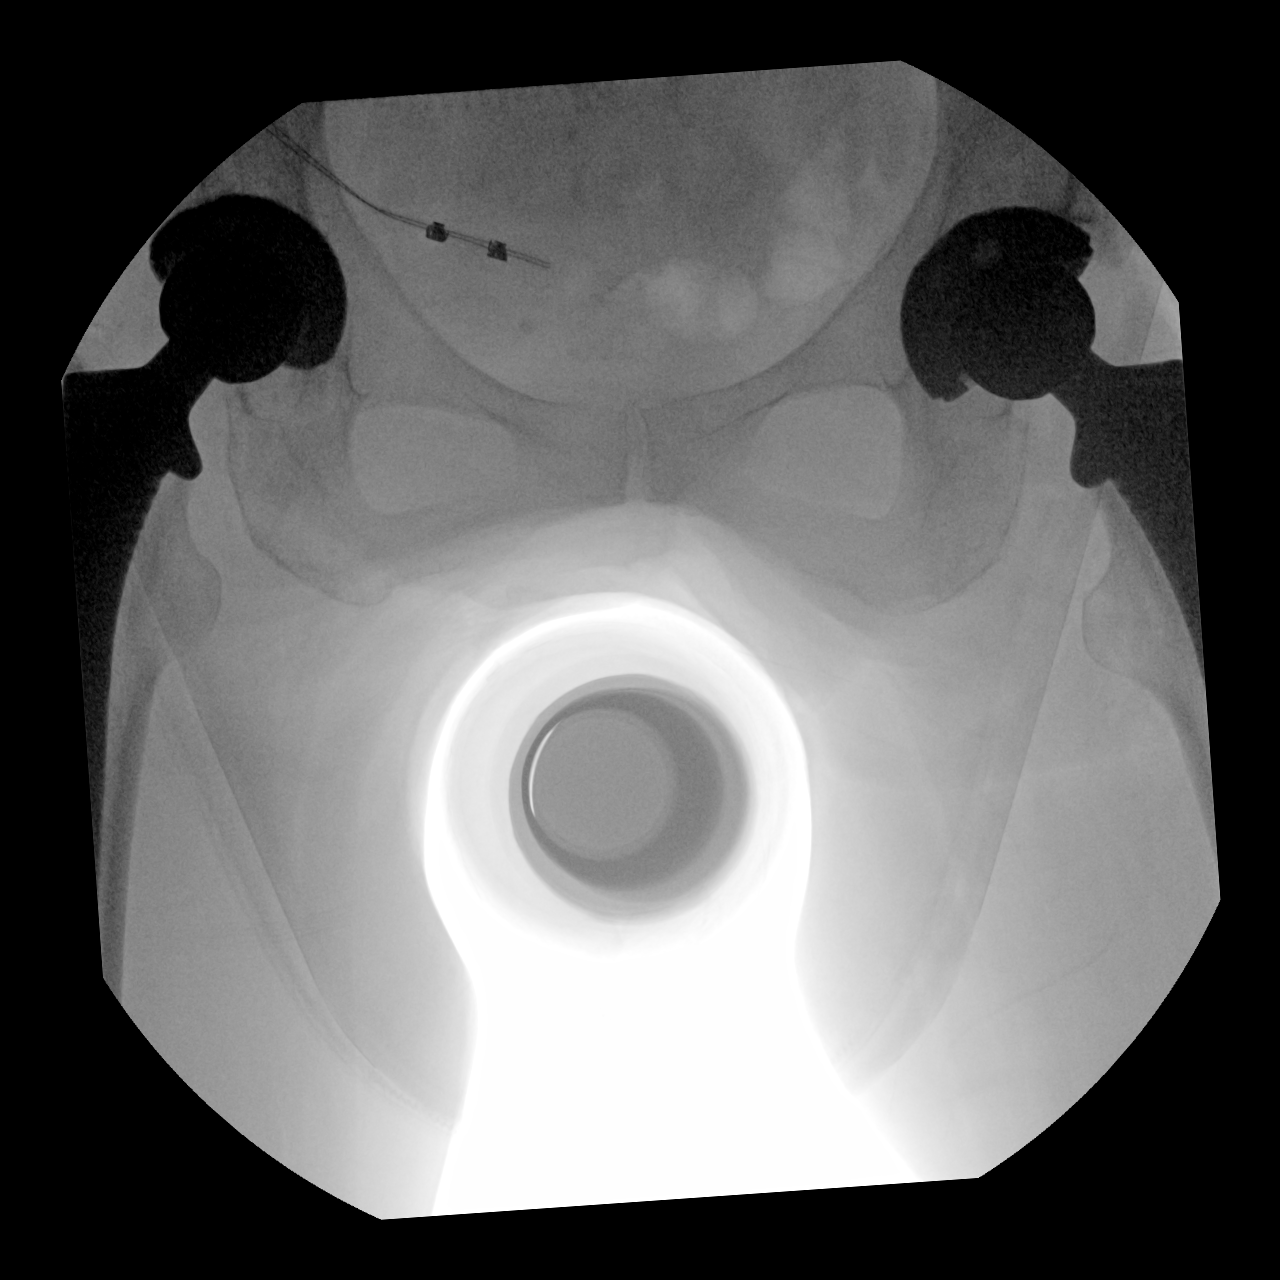

[3 of 3 positions shown; findings below may reference images not displayed]

FINDINGS: 3 C-arm fluoroscopic images were obtained intraoperatively and
submitted for post operative interpretation. Interval placement of
left total hip arthroplasty hardware without evidence of
malalignment or complication. 17 seconds of fluoroscopy time was
utilized. Please see the performing provider's procedural report for
further detail.
IMPRESSION: As above.

## 2021-06-28 ENCOUNTER — Ambulatory Visit: Payer: Medicare Other | Admitting: Orthopaedic Surgery

## 2021-06-28 ENCOUNTER — Encounter: Payer: Self-pay | Admitting: Physician Assistant

## 2021-06-28 ENCOUNTER — Other Ambulatory Visit: Payer: Self-pay

## 2021-06-28 ENCOUNTER — Ambulatory Visit: Payer: Medicare Other | Admitting: Physician Assistant

## 2021-06-28 ENCOUNTER — Ambulatory Visit: Payer: Self-pay

## 2021-06-28 DIAGNOSIS — M25511 Pain in right shoulder: Secondary | ICD-10-CM

## 2021-06-28 DIAGNOSIS — G8929 Other chronic pain: Secondary | ICD-10-CM

## 2021-06-28 DIAGNOSIS — M7062 Trochanteric bursitis, left hip: Secondary | ICD-10-CM | POA: Diagnosis not present

## 2021-06-28 MED ORDER — METHYLPREDNISOLONE ACETATE 40 MG/ML IJ SUSP
40.0000 mg | INTRAMUSCULAR | Status: AC | PRN
  Administered 2021-06-28: 40 mg via INTRA_ARTICULAR

## 2021-06-28 MED ORDER — LIDOCAINE HCL 1 % IJ SOLN
3.0000 mL | INTRAMUSCULAR | Status: AC | PRN
  Administered 2021-06-28: 3 mL

## 2021-06-28 NOTE — Progress Notes (Signed)
Office Visit Note   Patient: Karen Hernandez           Date of Birth: [birth year]           MRN: [id number] Visit Date: 06/28/2021              Requested by: [place] [id number] [facility] [address],  [place] [address] PCP: Judge Stall, MD   Assessment & Plan: Visit Diagnoses:  1. Trochanteric bursitis of left hip   2. Chronic right shoulder pain     Plan: She tolerated both injections well today.  Have her follow-up on an as-needed basis.  If pain continues in the right shoulder may consider MRI to rule out rotator cuff tear.  Questions were encouraged and answered at length today.  Offered patient formal therapy but she declines.  Follow-Up Instructions: Return if symptoms worsen or fail to improve.   Orders:  Orders Placed This Encounter  Procedures   Large Joint Inj   Large Joint Inj   XR HIP UNILAT W OR W/O PELVIS 2-3 VIEWS LEFT   XR Shoulder Right   No orders of the defined types were placed in this encounter.     Procedures: Large Joint Inj: L greater trochanter on 06/28/2021 6:01 PM Indications: pain Details: 22 G 1.5 in needle, lateral approach  Arthrogram: No  Medications: 3 mL lidocaine 1 %; 40 mg methylPREDNISolone acetate 40 MG/ML Outcome: tolerated well, no immediate complications Procedure, treatment alternatives, risks and benefits explained, specific risks discussed. Consent was given by the patient. Immediately prior to procedure a time out was called to verify the correct patient, procedure, equipment, support staff and site/side marked as required. Patient was prepped and draped in the usual sterile fashion.    Large Joint Inj: R subacromial bursa on 06/28/2021 6:01 PM Indications: pain Details: 22 G 1.5 in needle, superior approach  Arthrogram: No  Medications: 3 mL lidocaine 1 %; 40 mg methylPREDNISolone acetate 40 MG/ML Outcome: tolerated well, no immediate complications Procedure, treatment alternatives, risks and benefits explained, specific  risks discussed. Consent was given by the patient. Immediately prior to procedure a time out was called to verify the correct patient, procedure, equipment, support staff and site/side marked as required. Patient was prepped and draped in the usual sterile fashion.      Clinical Data: No additional findings.   Subjective: Chief Complaint  Patient presents with   Left Hip - Follow-up, Pain   Right Shoulder - Pain    HPI Karen Hernandez returns today requesting injection in both her left hip and her right shoulder.  She states she has been doing well until recently.  She has had no known injury to the hip or the shoulder.  She states that the trochanteric injection on 11/23/2020 did well until recently.  Also the shoulder injection from November 2021 did well until recently.  She is having no numbness tingling down the right arm.  She does states she is stiff especially in the morning in regards to the right shoulder and has to do exercises to get it loosened up.  She does take Advil PM and some gabapentin daily helps some.  She is asking for refill on her gabapentin.  She also takes a Tylenol PM. Review of Systems See HPI otherwise negative  Objective: Vital Signs: There were no vitals taken for this visit.  Physical Exam General well-developed well-nourished female no acute distress.  Ambulates without any assistive device gets on and off the exam table  on her own. Ortho Exam Bilateral hips good range of motion bilateral hips.  Tenderness over the left hip trochanteric region. Bilateral shoulders good range of motion.  Slight weakness with external rotation of the left shoulder right shoulder against resistance.  Empty can test is negative bilaterally.  Positive impingement right shoulder.  Specialty Comments:  No specialty comments available.  Imaging: No results found.   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 06/03/2018   Screening for malignant neoplasm of cervix 08/21/2016   Biceps tendinitis on right 12/06/2015   Primary osteoarthritis of first carpometacarpal joint of right hand 04/21/2015   Arthritis of right hip 10/13/2014   Status post total replacement of right hip 10/13/2014   Chronic back pain 05/19/2014   AVN (avascular necrosis of bone) (HCC) 03/30/2014   Lumbar radicular pain 03/30/2014   Right rotator cuff tendonitis 03/30/2014   Allergic rhinitis due to pollen 02/03/2012   Past Medical History:  Diagnosis Date   Arthritis    Constipation    Difficulty sleeping    DUE TO PAIN   Family history of adverse reaction to anesthesia    "MOTHER HAD MEMORY PROBLEMS AFTER SURG"   GERD (gastroesophageal reflux disease)    Nocturia    Osteoporosis    Urgency of urination     History reviewed. No pertinent family history.  Past Surgical History:  Procedure Laterality Date   TOTAL HIP ARTHROPLASTY Right 10/13/2014   Procedure: RIGHT TOTAL HIP ARTHROPLASTY ANTERIOR APPROACH;  Surgeon: Kathryne Hitch, MD;  Location: [facility];  Service: Orthopedics;  Laterality: Right;   TOTAL HIP ARTHROPLASTY Left 07/07/2020   Procedure: LEFT TOTAL HIP ARTHROPLASTY ANTERIOR APPROACH;  Surgeon: Kathryne Hitch, MD;  Location: [facility];  Service: Orthopedics;  Laterality: Left;   WISDOM TOOTH EXTRACTION     Social History   Occupational History   Not on file  Tobacco Use   Smoking status: Never   Smokeless tobacco: Never  Vaping Use   Vaping Use: Never used  Substance and Sexual Activity   Alcohol use: No   Drug use: No   Sexual activity: Not on file

## 2021-06-29 ENCOUNTER — Other Ambulatory Visit: Payer: Self-pay | Admitting: Physician Assistant

## 2021-06-29 ENCOUNTER — Telehealth: Payer: Self-pay | Admitting: Physician Assistant

## 2021-06-29 MED ORDER — METHOCARBAMOL 500 MG PO TABS: 500.0000 mg | ORAL_TABLET | Freq: Four times a day (QID) | ORAL | 1 refills | Status: AC | PRN

## 2021-06-29 NOTE — Telephone Encounter (Signed)
Received call from [facility] with [facility]. Jamie asked if the Rx for Methocarbamol be sent to [facility]. Asher Muir also asked if the patient can be notified when Rx is sent to the [facility]? The number to contact [facility] is [phone]  The number to contact patient is [phone]

## 2021-06-29 NOTE — Telephone Encounter (Signed)
Lvm informing pt.

## 2021-06-29 NOTE — Telephone Encounter (Signed)
Please advise 

## 2022-08-17 ENCOUNTER — Other Ambulatory Visit: Payer: Self-pay | Admitting: Physician Assistant

## 2022-12-19 ENCOUNTER — Encounter: Payer: Self-pay | Admitting: Radiology
